# Patient Record
Sex: Female | Born: 1945 | Race: White | Hispanic: No | Smoking: Current every day smoker
Health system: Southern US, Community
[De-identification: ages and names within clinical notes are randomized; demographics above are authoritative.]

## PROBLEM LIST (undated history)

## (undated) DIAGNOSIS — I4891 Unspecified atrial fibrillation: Secondary | ICD-10-CM

## (undated) DIAGNOSIS — I10 Essential (primary) hypertension: Secondary | ICD-10-CM

## (undated) HISTORY — PX: ORIF FEMUR FRACTURE: SHX2119

## (undated) HISTORY — PX: EXPLORATORY LAPAROTOMY: SUR591

## (undated) HISTORY — DX: Essential (primary) hypertension: I10

## (undated) HISTORY — PX: HYSTEROTOMY: SHX1776

## (undated) HISTORY — PX: TUBAL LIGATION: SHX77

---

## 2011-04-02 DIAGNOSIS — R319 Hematuria, unspecified: Secondary | ICD-10-CM | POA: Insufficient documentation

## 2013-07-10 DIAGNOSIS — M47816 Spondylosis without myelopathy or radiculopathy, lumbar region: Secondary | ICD-10-CM | POA: Insufficient documentation

## 2013-07-10 DIAGNOSIS — M549 Dorsalgia, unspecified: Secondary | ICD-10-CM | POA: Insufficient documentation

## 2016-09-08 ENCOUNTER — Encounter: Payer: Self-pay | Admitting: Family Medicine

## 2016-09-08 ENCOUNTER — Ambulatory Visit (INDEPENDENT_AMBULATORY_CARE_PROVIDER_SITE_OTHER): Payer: Medicare Other | Admitting: Family Medicine

## 2016-09-08 ENCOUNTER — Other Ambulatory Visit (HOSPITAL_COMMUNITY)
Admission: RE | Admit: 2016-09-08 | Discharge: 2016-09-08 | Disposition: A | Discharge status: Home / Self Care | Payer: Medicare Other | Source: Ambulatory Visit | Attending: Family Medicine | Admitting: Family Medicine

## 2016-09-08 VITALS — BP 122/78 | HR 72 | Wt 208.0 lb

## 2016-09-08 DIAGNOSIS — F329 Major depressive disorder, single episode, unspecified: Secondary | ICD-10-CM | POA: Insufficient documentation

## 2016-09-08 DIAGNOSIS — D510 Vitamin B12 deficiency anemia due to intrinsic factor deficiency: Secondary | ICD-10-CM | POA: Insufficient documentation

## 2016-09-08 DIAGNOSIS — N898 Other specified noninflammatory disorders of vagina: Secondary | ICD-10-CM | POA: Diagnosis not present

## 2016-09-08 DIAGNOSIS — K219 Gastro-esophageal reflux disease without esophagitis: Secondary | ICD-10-CM | POA: Insufficient documentation

## 2016-09-08 DIAGNOSIS — E785 Hyperlipidemia, unspecified: Secondary | ICD-10-CM | POA: Insufficient documentation

## 2016-09-08 DIAGNOSIS — F419 Anxiety disorder, unspecified: Secondary | ICD-10-CM | POA: Insufficient documentation

## 2016-09-08 DIAGNOSIS — M810 Age-related osteoporosis without current pathological fracture: Secondary | ICD-10-CM | POA: Insufficient documentation

## 2016-09-08 DIAGNOSIS — F32A Depression, unspecified: Secondary | ICD-10-CM | POA: Insufficient documentation

## 2016-09-08 MED ORDER — NYSTATIN 100000 UNIT/GM EX POWD: Freq: Four times a day (QID) | CUTANEOUS | 0 refills | Status: AC

## 2016-09-08 NOTE — Patient Instructions (Addendum)
Miconazole aerosol powder or liquid What is this medicine? MICONAZOLE (mi KON a zole) is an antifungal medicine. It is used to treat certain kinds of fungal or yeast infections of the skin. This medicine may be used for other purposes; ask your health care provider or pharmacist if you have questions. COMMON BRAND NAME(S): Cruex, Desenex, Desenex Jock Itch, Lotrimin AF, Lotrimin AF Antifungal Liquid, Lotrimin AF Deodorant, Lotrimin AF Powder, Lotrimin AF Spray, Micatin, Neosporin AF, Ting Antifungal What should I tell my health care provider before I take this medicine? They need to know if you have any of these conditions: -diabetes -HIV or AIDS -immune system problems -other chronic health condition -recent chemotherapy treatments -an unusual or allergic reaction to miconazole, other medicines, foods, dyes or preservatives -pregnant or trying to get pregnant -breast-feeding How should I use this medicine? This medicine is for external use only. Do not take by mouth. Follow the directions on the label. Wash hands before and after use. Cleanse and dry affected area thoroughly. Shake well. Spray on the affected area (or into socks and shoes). Do not breathe in the medicine. Do not use your medicine more often than directed. Use this medicine for the full amount of time recommended on the package or by your doctor or health care professional even if you begin to feel better. Do not use for more than 4 weeks without advice. Talk to your pediatrician regarding the use of this medicine in children. While this medicine may be prescribed for children as young as 2 years for selected conditions, precautions do apply. Overdosage: If you think you have taken too much of this medicine contact a poison control center or emergency room at once. NOTE: This medicine is only for you. Do not share this medicine with others. What if I miss a dose? If you miss a dose, use it as soon as you can. If it is almost  time for your next dose, use only that dose. Do not use double or extra doses. What may interact with this medicine? Interactions are not expected. Do not use any other skin products on the affected area without asking your doctor or health care professional. This list may not describe all possible interactions. Give your health care provider a list of all the medicines, herbs, non-prescription drugs, or dietary supplements you use. Also tell them if you smoke, drink alcohol, or use illegal drugs. Some items may interact with your medicine. What should I watch for while using this medicine? Tell your doctor or healthcare professional if your symptoms do not start to get better or if they get worse. You may have a skin infection that does not respond to this medicine. Do not spray near your eyes. If you do get any in your eyes, rinse out with plenty of cool tap water. If you are using this medicine for 'jock itch' be sure to dry the groin completely after bathing. Do not wear underwear that is tight-fitting or made from synthetic fibers like rayon or nylon. Wear loose-fitting, cotton underwear. If you are using this medicine for athlete's foot be sure to dry your feet carefully after bathing, especially between the toes. Do not wear socks made from wool or synthetic materials like rayon or nylon. Wear clean cotton socks and change them at least once a day, change them more if your feet sweat a lot. Also, try to wear sandals or shoes that are well-ventilated. What side effects may I notice from receiving this medicine? Side  effects that you should report to your doctor or health care professional as soon as possible: -allergic reactions like skin rash, itching or hives, swelling of the face, lips, or tongue -increased inflammation, redness, or pain at the affected area Side effects that usually do not require medical attention (report to your doctor or health care professional if they continue or are  bothersome): -mild skin irritation, burning, or itching at the affected area This list may not describe all possible side effects. Call your doctor for medical advice about side effects. You may report side effects to FDA at 1-800-FDA-1088. Where should I keep my medicine? Keep out of the reach of children. Store at room temperature between 15 and 30 degrees C (59 and 86 degrees F). Throw away any unused medicine after the expiration date. Some products may contain alcohol or have other flammable components. Do not use while smoking or near heat or flame. NOTE: This sheet is a summary. It may not cover all possible information. If you have questions about this medicine, talk to your doctor, pharmacist, or health care provider.  2018 Elsevier/Gold Standard (2007-07-28 10:56:02)

## 2016-09-08 NOTE — Progress Notes (Signed)
   Subjective:    Patient ID: Becky Warner is a 71 y.o. female presenting with VAGINAL BUMPS  on 09/08/2016  HPI: Has noted several week h/o clear vaginal discharge and blistered skin in her groin. Has had previous yeast infections with clumpy discharge. Has not had a new sexual partner. Previous hysterectomy. Reports some leakage of urine with heavy lifting and was wearing a pad, but has not for the past several weeks. Has been using a homeopathic thick cream to help ease the pain of the groin.  Review of Systems  Constitutional: Negative for chills and fever.  Respiratory: Negative for shortness of breath.   Cardiovascular: Negative for chest pain.  Gastrointestinal: Negative for abdominal pain, nausea and vomiting.  Genitourinary: Positive for vaginal discharge and vaginal pain. Negative for dysuria and menstrual problem.  Skin: Negative for rash.      Objective:    There were no vitals taken for this visit. Physical Exam  Constitutional: She is oriented to person, place, and time. She appears well-developed and well-nourished. No distress.  HENT:  Head: Normocephalic and atraumatic.  Eyes: No scleral icterus.  Neck: Neck supple.  Cardiovascular: Normal rate.   Pulmonary/Chest: Effort normal.  Abdominal: Soft.  Genitourinary: Vaginal discharge (clear) found.  Genitourinary Comments: Erythematous plaque noted along intertrigonal region bilaterally  Neurological: She is alert and oriented to person, place, and time.  Skin: Skin is warm and dry.  Psychiatric: She has a normal mood and affect.        Assessment & Plan:  Vaginal irritation - likely tinea--trial of nystatin powder. keep area dry. - Plan: Cervicovaginal ancillary only, nystatin (MYCOSTATIN/NYSTOP) powder   Total face-to-face time with patient: 20 minutes. Over 50% of encounter was spent on counseling and coordination of care. Return in about 3 months (around 12/09/2016).  Reva Boresanya S Renita Brocks 09/08/2016 3:38 PM

## 2016-09-09 ENCOUNTER — Encounter: Payer: Self-pay | Admitting: Family Medicine

## 2016-09-10 LAB — CERVICOVAGINAL ANCILLARY ONLY
Bacterial vaginitis: NEGATIVE
CANDIDA VAGINITIS: POSITIVE — AB
Trichomonas: NEGATIVE

## 2016-09-11 ENCOUNTER — Telehealth: Payer: Self-pay

## 2016-09-11 MED ORDER — FLUCONAZOLE 150 MG PO TABS: 150.0000 mg | ORAL_TABLET | Freq: Once | ORAL | 0 refills | Status: DC

## 2016-09-11 NOTE — Telephone Encounter (Signed)
-----   Message from Reva Boresanya S Pratt, MD sent at 09/11/2016 10:19 AM EDT ----- Has yeast on wet prep--may send additional Diflucan--already on powder

## 2016-09-11 NOTE — Telephone Encounter (Signed)
Attempted to reach patient multiple time and phone rings busy. Armandina StammerJennifer Howard RNBSN

## 2016-10-06 ENCOUNTER — Telehealth: Payer: Self-pay | Admitting: General Practice

## 2016-10-06 ENCOUNTER — Other Ambulatory Visit: Payer: Self-pay | Admitting: Family Medicine

## 2016-10-06 NOTE — Telephone Encounter (Signed)
Patient called and left message stating she is a patient of Dr Tawni LevyPratt's and was recently treated for a vaginal infection with a medication. Patient states the medication hasn't really worked or done anything & she would like a refill.

## 2017-01-15 ENCOUNTER — Other Ambulatory Visit: Payer: Self-pay | Admitting: Family Medicine

## 2017-05-24 ENCOUNTER — Other Ambulatory Visit: Payer: Self-pay | Admitting: Family Medicine

## 2017-06-06 ENCOUNTER — Other Ambulatory Visit: Payer: Self-pay | Admitting: Family Medicine

## 2019-06-02 ENCOUNTER — Other Ambulatory Visit: Payer: Self-pay

## 2019-06-02 ENCOUNTER — Emergency Department (INDEPENDENT_AMBULATORY_CARE_PROVIDER_SITE_OTHER): Payer: Medicare Other

## 2019-06-02 ENCOUNTER — Emergency Department (INDEPENDENT_AMBULATORY_CARE_PROVIDER_SITE_OTHER)
Admission: EM | Admit: 2019-06-02 | Discharge: 2019-06-02 | Disposition: A | Discharge status: Home / Self Care | Payer: Medicare Other | Source: Home / Self Care

## 2019-06-02 DIAGNOSIS — M5416 Radiculopathy, lumbar region: Secondary | ICD-10-CM

## 2019-06-02 MED ORDER — MELOXICAM 7.5 MG PO TABS: 7.5000 mg | ORAL_TABLET | Freq: Every day | ORAL | 0 refills | Status: AC

## 2019-06-02 MED ORDER — KETOROLAC TROMETHAMINE 30 MG/ML IJ SOLN
30.0000 mg | Freq: Once | INTRAMUSCULAR | Status: AC
  Administered 2019-06-02: 14:00:00 30 mg via INTRAMUSCULAR

## 2019-06-02 NOTE — ED Triage Notes (Addendum)
Pt c/o bilateral leg pain x 1 mos. Worsening in the last couple days. Hx of DDD and sciatica. Pain 7/10. Taking hydrocodone as needed with little relief.

## 2019-06-02 NOTE — Discharge Instructions (Addendum)
  Meloxicam (Mobic) is an antiinflammatory to help with pain and inflammation.  Do not take ibuprofen, Advil, Aleve, or any other medications that contain NSAIDs while taking meloxicam as this may cause stomach upset or even ulcers if taken in large amounts for an extended period of time.   Please call to schedule an appointment with Sports Medicine next week or follow up with your family doctor next week as previously scheduled. You may need further evaluation with an MRI of your lower spine and/or referral to a physical therapist or neurosurgeon for further evaluation and treatment of your pain.  Call 911 or have someone drive you to the hospital if you develop worsening lower back pain and are unable to walk, difficulty urinating or having a bowel movement, weakness in your legs, or other new concerning symptoms develop.

## 2019-06-02 NOTE — ED Provider Notes (Signed)
[facility]    CSN: [id number] Arrival date & time: 06/02/19  1300      History   Chief Complaint Chief Complaint  Patient presents with  . Leg Pain    bilateral    HPI Becky Warner is a 74 y.o. female.   HPI  Becky Warner is a 75 y.o. female presenting to UC with c/o bilateral upper leg pain that is aching and sore, sharp at times for about 1 month. Pt reports pain started shortly after retiring, where she use to carry 2 pounds of water in each hand and walk around watering trees.  Pain has worsened over the last 1-2 weeks. She called her chiropractor to see if he could "do an adjustment" but due to hx of DDD and sciatica with pain in her upper legs, pt was directed to UC for further evaluation.  She has taken hydrocodone with mild relief. Denies change in bowel or bladder habits. Denies weakness or numbness to her legs. No fever, chills or rashes. No recent injury.    Past Medical History:  Diagnosis Date  . Hypertension     Patient Active Problem List   Diagnosis Date Noted  . Anxiety 09/08/2016  . Depression 09/08/2016  . GERD (gastroesophageal reflux disease) 09/08/2016  . Hyperlipidemia 09/08/2016  . Osteoporosis 09/08/2016  . Pernicious anemia 09/08/2016  . Back pain 07/10/2013  . Hematuria of undiagnosed cause 04/02/2011    Past Surgical History:  Procedure Laterality Date  . EXPLORATORY LAPAROTOMY    . HYSTEROTOMY    . ORIF FEMUR FRACTURE    . TUBAL LIGATION      OB History    Gravida  3   Para  3   Term      Preterm      AB      Living  3     SAB      TAB      Ectopic      Multiple      Live Births               Home Medications    Prior to Admission medications   Medication Sig Start Date End Date Taking? Authorizing Provider  albuterol (PROAIR HFA) 108 (90 Base) MCG/ACT inhaler INHALE 1 PUFF INTO THE LUNGS EVERY 6 (SIX) HOURS AS NEEDED FOR WHEEZING OR SHORTNESS OF BREATH. 06/14/15   [provider]    ALPRAZolam Duanne Moron) 1 MG tablet Take 1 mg by mouth 2 (two) times daily. 09/03/16   [provider]  amLODipine (NORVASC) 5 MG tablet Take 10 mg by mouth.    [provider]  calcium carbonate (TUMS EX) 750 MG chewable tablet Chew 750 mg by mouth.    [provider]  estradiol (ESTRACE) 1 MG tablet Take 1 mg by mouth.    [provider]  HYDROcodone-acetaminophen (NORCO/VICODIN) 5-325 MG tablet Take 1 tablet by mouth every 6 (six) hours as needed. 05/13/19   [provider]  meloxicam (MOBIC) 7.5 MG tablet Take 1-2 tablets (7.5-15 mg total) by mouth daily for 7 days. 06/02/19 06/09/19  Noe Gens, PA-C  Multiple Vitamin (MULTIVITAMIN) capsule Take by mouth.    [provider]  nystatin (MYCOSTATIN/NYSTOP) powder Apply topically 4 (four) times daily. 09/08/16   Donnamae Jude, MD  omeprazole (PRILOSEC) 40 MG capsule TAKE ONE CAPSULE (40 MG TOTAL) BY MOUTH DAILY. 08/06/16   [provider]  pravastatin (PRAVACHOL) 40 MG tablet Take by mouth.  04/20/16   [provider]  pregabalin (LYRICA) 50 MG capsule 1 each. 07/16/16   [provider]  venlafaxine XR (EFFEXOR-XR) 75 MG 24 hr capsule Take 75 mg by mouth daily. 12/30/18   [provider]  vitamin C (ASCORBIC ACID) 500 MG tablet Take 500 mg by mouth.    [provider]    Family History History reviewed. No pertinent family history.  Social History Social History   Tobacco Use  . Smoking status: Current Every Day Smoker  . Smokeless tobacco: Current User  Substance Use Topics  . Alcohol use: No  . Drug use: No     Allergies   Penicillins, Sulfa antibiotics, Fluocinolone, and Sulfamethoxazole   Review of Systems Review of Systems  Constitutional: Negative for chills and fever.  Genitourinary: Negative for dysuria, frequency and hematuria.  Musculoskeletal: Positive for back pain and myalgias. Negative for gait problem, neck pain and neck  stiffness.  Neurological: Negative for weakness and numbness.     Physical Exam Triage Vital Signs ED Triage Vitals  Enc Vitals Group     BP 06/02/19 1320 (!) 148/83     Pulse Rate 06/02/19 1320 96     Resp 06/02/19 1320 18     Temp 06/02/19 1320 98 F (36.7 C)     Temp Source 06/02/19 1320 Oral     SpO2 06/02/19 1320 96 %     Weight 06/02/19 1324 185 lb (83.9 kg)     Height 06/02/19 1324 5\' 6"  (1.676 m)     Head Circumference --      Peak Flow --      Pain Score 06/02/19 1324 7     Pain Loc --      Pain Edu? --      Excl. in GC? --    No data found.  Updated Vital Signs BP (!) 148/83 (BP Location: Left Arm)   Pulse 96   Temp 98 F (36.7 C) (Oral)   Resp 18   Ht 5\' 6"  (1.676 m)   Wt 185 lb (83.9 kg)   SpO2 96%   BMI 29.86 kg/m   Visual Acuity Right Eye Distance:   Left Eye Distance:   Bilateral Distance:    Right Eye Near:   Left Eye Near:    Bilateral Near:     Physical Exam Vitals and nursing note reviewed.  Constitutional:      Appearance: Normal appearance. She is well-developed.  HENT:     Head: Normocephalic and atraumatic.  Cardiovascular:     Rate and Rhythm: Normal rate.  Pulmonary:     Effort: Pulmonary effort is normal.  Musculoskeletal:        General: Tenderness present. Normal range of motion.     Cervical back: Normal range of motion.     Comments: Mild tenderness across lower back. No localized tenderness. No masses palpated. Full ROM bilateral hips w/o crepitus. Mild tenderness to anterior upper legs. Full ROM knees.  Muscle compartments are soft.  Skin:    General: Skin is warm and dry.     Findings: No rash.  Neurological:     Mental Status: She is alert and oriented to person, place, and time.  Psychiatric:        Behavior: Behavior normal.      UC Treatments / Results  Labs (all labs ordered are listed, but only abnormal results are displayed) Labs Reviewed - No data to display  EKG   Radiology DG  Lumbar Spine  Complete  Result Date: 06/02/2019 CLINICAL DATA:  Acute low back pain two weeks.  Initial encounter. EXAM: LUMBAR SPINE - COMPLETE 4+ VIEW COMPARISON:  None. FINDINGS: No acute fracture or subluxation identified. Mild degenerative disc disease/spondylosis from L3-S1 noted. Diffuse osteopenia is present. No focal bony lesions are identified. IMPRESSION: 1. No evidence of acute abnormality. 2. Mild degenerative disc disease/spondylosis from L3-S1. Electronically Signed   By: Harmon Pier M.D.   On: 06/02/2019 14:17    Procedures Procedures (including critical care time)  Medications Ordered in UC Medications  ketorolac (TORADOL) 30 MG/ML injection 30 mg (30 mg Intramuscular Given 06/02/19 1346)    Initial Impression / Assessment and Plan / UC Course  I have reviewed the triage vital signs and the nursing notes.  Pertinent labs & imaging results that were available during my care of the patient were reviewed by me and considered in my medical decision making (see chart for details).    Discussed imaging with pt No red flag symptoms today. No evidence of cauda equina at this time.  Encouraged pt f/u with sports medicine for further evaluation and treatment of her symptoms.  Discussed symptoms that warrant emergent care in the ED. AVS provided.  Final Clinical Impressions(s) / UC Diagnoses   Final diagnoses:  Acute radicular low back pain     Discharge Instructions      Meloxicam (Mobic) is an antiinflammatory to help with pain and inflammation.  Do not take ibuprofen, Advil, Aleve, or any other medications that contain NSAIDs while taking meloxicam as this may cause stomach upset or even ulcers if taken in large amounts for an extended period of time.   Please call to schedule an appointment with Sports Medicine next week or follow up with your family doctor next week as previously scheduled. You may need further evaluation with an MRI of your lower spine and/or referral to a physical  therapist or neurosurgeon for further evaluation and treatment of your pain.  Call 911 or have someone drive you to the hospital if you develop worsening lower back pain and are unable to walk, difficulty urinating or having a bowel movement, weakness in your legs, or other new concerning symptoms develop.     ED Prescriptions    Medication Sig Dispense Auth. Provider   meloxicam (MOBIC) 7.5 MG tablet Take 1-2 tablets (7.5-15 mg total) by mouth daily for 7 days. 14 tablet Lurene Shadow, [place]     I have reviewed the PDMP during this encounter.   Lurene Shadow, [place] 06/02/19 1551

## 2019-06-06 ENCOUNTER — Encounter: Payer: Medicare Other | Admitting: Sports Medicine

## 2019-06-12 ENCOUNTER — Encounter: Payer: Medicare Other | Admitting: Sports Medicine

## 2019-06-13 ENCOUNTER — Other Ambulatory Visit: Payer: Self-pay

## 2019-06-13 ENCOUNTER — Encounter: Payer: Self-pay | Admitting: Sports Medicine

## 2019-06-13 ENCOUNTER — Ambulatory Visit (INDEPENDENT_AMBULATORY_CARE_PROVIDER_SITE_OTHER): Payer: Medicare Other | Admitting: Sports Medicine

## 2019-06-13 DIAGNOSIS — M47816 Spondylosis without myelopathy or radiculopathy, lumbar region: Secondary | ICD-10-CM

## 2019-06-13 DIAGNOSIS — M4807 Spinal stenosis, lumbosacral region: Secondary | ICD-10-CM | POA: Diagnosis not present

## 2019-06-13 MED ORDER — PREDNISONE 50 MG PO TABS: ORAL_TABLET | ORAL | 0 refills | Status: DC

## 2019-06-13 MED ORDER — PREGABALIN 100 MG PO CAPS: 100.0000 mg | ORAL_CAPSULE | Freq: Two times a day (BID) | ORAL | 3 refills | Status: DC

## 2019-06-13 NOTE — Progress Notes (Signed)
    Procedures performed today:    None.  Independent interpretation of notes and tests performed by another provider:   None.  Brief History, Exam, Impression, and Recommendations:    Lumbar spondylosis This is a pleasant 74 year old female, she is a long history of chronic low back pain, with radiation to the bilateral anterior thighs, better with flexion. She endorses that her worst symptoms are her anterior thigh pain. This is all consistent with lumbar spinal stenosis. She is currently seeing Dr. Oneal Grout and is getting some medical pain management here, it sounds as though she had a lumbar epidural at some point in the past without much improvement. She went to urgent care and was given meloxicam which is not helping much. 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 I explained to her the process of treatment and set expectations regarding improvement in her back pain. She will continue hydrocodone with Dr. Oneal Grout, I am going to increase her dose of Lyrica from 50 mg to 100 mg twice daily to 3 times daily, we are going to do a 5-day burst of prednisone, lumbar spine MRI for interventional planning and aggressive formal physical therapy, I like to see her back in approximately 6 weeks and we can proceed with interventional treatment in the forms of epidurals and/or facet joint injections if no better.    ___________________________________________ Ihor Austin. Benjamin Stain, M.D., ABFM., CAQSM. Primary Care and Sports Medicine [facility]  Adjunct Instructor of Family Medicine  [employer]

## 2019-06-13 NOTE — Assessment & Plan Note (Addendum)
This is a pleasant 74 year old female, she is a long history of chronic low back pain, with radiation to the bilateral anterior thighs, better with flexion. She endorses that her worst symptoms are her anterior thigh pain. This is all consistent with lumbar spinal stenosis. She is currently seeing Dr. Oneal Grout and is getting some medical pain management here, it sounds as though she had a lumbar epidural at some point in the past without much improvement. She went to urgent care and was given meloxicam which is not helping much. 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 I explained to her the process of treatment and set expectations regarding improvement in her back pain. She will continue hydrocodone with Dr. Oneal Grout, I am going to increase her dose of Lyrica from 50 mg to 100 mg twice daily to 3 times daily, we are going to do a 5-day burst of prednisone, lumbar spine MRI for interventional planning and aggressive formal physical therapy, I like to see her back in approximately 6 weeks and we can proceed with interventional treatment in the forms of epidurals and/or facet joint injections if no better.

## 2019-06-16 ENCOUNTER — Ambulatory Visit: Payer: Medicare Other | Admitting: Rehabilitative and Restorative Service Providers"

## 2019-06-17 ENCOUNTER — Other Ambulatory Visit: Payer: Self-pay

## 2019-06-17 ENCOUNTER — Ambulatory Visit (INDEPENDENT_AMBULATORY_CARE_PROVIDER_SITE_OTHER): Payer: Medicare Other

## 2019-06-17 DIAGNOSIS — M4807 Spinal stenosis, lumbosacral region: Secondary | ICD-10-CM

## 2019-06-17 DIAGNOSIS — M47816 Spondylosis without myelopathy or radiculopathy, lumbar region: Secondary | ICD-10-CM | POA: Diagnosis not present

## 2019-06-20 ENCOUNTER — Ambulatory Visit: Payer: Medicare Other | Admitting: Physical Therapy

## 2019-06-21 ENCOUNTER — Ambulatory Visit: Payer: Medicare Other | Attending: Sports Medicine | Admitting: Physical Therapy

## 2019-06-21 ENCOUNTER — Other Ambulatory Visit: Payer: Self-pay

## 2019-06-21 ENCOUNTER — Encounter: Payer: Self-pay | Admitting: Physical Therapy

## 2019-06-21 DIAGNOSIS — G8929 Other chronic pain: Secondary | ICD-10-CM | POA: Insufficient documentation

## 2019-06-21 DIAGNOSIS — R2689 Other abnormalities of gait and mobility: Secondary | ICD-10-CM | POA: Diagnosis present

## 2019-06-21 DIAGNOSIS — M545 Low back pain, unspecified: Secondary | ICD-10-CM

## 2019-06-21 DIAGNOSIS — R29898 Other symptoms and signs involving the musculoskeletal system: Secondary | ICD-10-CM | POA: Diagnosis present

## 2019-06-21 NOTE — Therapy (Signed)
[facility] Outpatient Rehabilitation [facility] [address]  [address] [place], [place], [address] Phone: [phone]   Fax:  [phone]  Physical Therapy Evaluation  Patient Details  Name: Becky Warner MRN: [id number] Date of Birth: [birth year] Referring Provider (PT): Rodney Langton, MD   Encounter Date: 06/21/2019  PT End of Session - 06/21/19 1016    Visit Number  1    Number of Visits  13    Date for PT Re-Evaluation  08/02/19    Authorization Type  Medicare & Medicaid    PT Start Time  0920    PT Stop Time  1009    PT Time Calculation (min)  49 min    Activity Tolerance  Patient tolerated treatment well    Behavior During Therapy  [facility] for tasks assessed/performed       Past Medical History:  Diagnosis Date  . Hypertension     Past Surgical History:  Procedure Laterality Date  . EXPLORATORY LAPAROTOMY    . HYSTEROTOMY    . ORIF FEMUR FRACTURE    . TUBAL LIGATION      There were no vitals filed for this visit.   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 Feels that this increased her pain, so she has retired from this occupation. This past year the pain has been "worse than ever." Wearing a back brace today d/t increased pain this AM. Pain is located over B LB with intermittent radiation to B anterior thighs stopping at knees. Denies N/T but does report intermittent difficulty urinating for the past 6 months. Pain worse in AM and better as the day goes on and worse with prolonged sitting, especially when sitting upright.  Pain is not much better with pain meds. Most comfortable laying in R/L sidelying. Had been seeing a Chiropractor for her back pain, but not in the past 2 weeks.    Pertinent History  HTN, ORIF L femur fx    Limitations  Sitting;Lifting;Standing;Walking;House  hold activities    How long can you sit comfortably?  20 min    How long can you stand comfortably?  >10 min    How long can you walk comfortably?  >1 hour    Diagnostic tests  06/02/19 lumbar spine: Mild degenerative disc disease/spondylosis from L3-S1; 06/17/19 lumbar MRI: Advanced lower lumbar facet arthrosis; No evidence of neural impingement.    Patient Stated Goals  get rid of pain    Currently in Pain?  No/denies    Pain Location  Back    Pain Orientation  Right;Left;Lower    Pain Descriptors / Indicators  Sharp    Pain Type  Chronic pain    Pain Radiating Towards  B anterior thighs stopping at knees         [facility] PT Assessment - 06/21/19 0933      Assessment   Medical Diagnosis  Lumbar spondylosis    Referring Provider (PT)  Rodney Langton, MD    Onset Date/Surgical Date  06/20/16    Prior Therapy  no      Precautions   Precautions  None      Balance Screen   Has the patient fallen in the past 6 months  Yes    How many times?  2   tripped over  [employer]- hurt L rib but improving   Has the patient had a decrease in activity level because of a fear of falling?   Yes    Is the patient reluctant to leave their home because of a fear of falling?   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    Leisure  gardening      Cognition   Overall Cognitive Status  Within Functional Limits for tasks assessed      Sensation   Light Touch  Appears Intact      Coordination   Gross Motor  Movements are Fluid and Coordinated  Yes      Posture/Postural Control   Posture/Postural Control  Postural limitations    Postural Limitations  Rounded Shoulders;Forward head;Increased thoracic kyphosis;Weight shift left    Posture Comments  in sitting      ROM / Strength   AROM / PROM / Strength  AROM;Strength      AROM   AROM Assessment Site  Lumbar    Lumbar Flexion  toes    Lumbar Extension  mildly limited    Lumbar - Right Side Bend  proximal shin    Lumbar - Left Side Bend  proximal shin    Lumbar - Right Rotation  WNL    Lumbar - Left Rotation  WNL      Strength   Strength Assessment Site  Hip;Knee;Ankle    Right/Left Hip  Right;Left    Right Hip Flexion  4+/5    Right Hip Extension  4-/5    Right Hip External Rotation   4+/5    Right Hip Internal Rotation  4/5    Right Hip ABduction  4+/5    Right Hip ADduction  4+/5    Left Hip Flexion  4+/5    Left Hip Extension  4/5    Left Hip External Rotation  4+/5    Left Hip Internal Rotation  4/5    Left Hip ABduction  4+/5    Left Hip ADduction  4+/5    Right/Left Knee  Right;Left    Right Knee Flexion  4+/5    Right Knee Extension  4+/5    Left Knee Flexion  4+/5    Left Knee Extension  4+/5    Right/Left Ankle  Right;Left    Right Ankle Dorsiflexion  4+/5    Right Ankle Plantar Flexion  4+/5    Left Ankle Dorsiflexion  4+/5    Left Ankle Plantar Flexion  4+/5      Flexibility   Soft Tissue Assessment /Muscle Length  yes    Hamstrings  B WFL    Quadriceps  mildly tight on B LEs with mod thomas stretch    Piriformis  L moderately tight with fig 4 and KTOS, mildly on R LE  Palpation   Palpation comment  TTP over midline of L4, over B proximal glutes and QL; increased tone over B posterior chain      Ambulation/Gait   Ambulation/Gait  Yes    Assistive device  None    Gait Pattern  Step-to pattern;Step-through pattern;Lateral trunk lean to left;Lateral trunk lean to right;Decreased step length -  right;Decreased step length - left;Trunk flexed    Ambulation Surface  Level;Indoor    Gait velocity  slightly decreased                  Objective measurements completed on examination: See above findings.              PT Education - 06/21/19 1016    Education Details  prognosis, POC, HEP, discussion on benefits of continued movement for arthritic pain    Person(s) Educated  Patient    Methods  Explanation;Demonstration;Tactile cues;Verbal cues;Handout    Comprehension  Verbalized understanding;Returned demonstration       PT Short Term Goals - 06/21/19 1028      PT SHORT TERM GOAL #1   Title  Patient to be independent with initial HEP.    Time  3    Period  Weeks    Status  New    Target Date  07/12/19        PT Long Term Goals - 06/21/19 1028      PT LONG TERM GOAL #1   Title  Patient to be independent with advanced HEP.    Time  6    Period  Weeks    Status  New    Target Date  08/02/19      PT LONG TERM GOAL #2   Title  Patient to demonstrate B hip strength >/=4+/5.    Time  6    Period  Weeks    Status  New    Target Date  08/02/19      PT LONG TERM GOAL #3   Title  Patient to demonstrate mild tightness in B piriformis and hip flexor/quads remaining.    Time  6    Period  Weeks    Status  New    Target Date  08/02/19      PT LONG TERM GOAL #4   Title  Patient to demonstrate proper lifting mechanics to lift 20lbs from ground to counter with 0/10 pain.    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    Time  6    Period  Weeks    Status  New    Target Date  08/02/19             Plan - 06/21/19 1020    Clinical Impression Statement  Patient is a 74y/o F presenting to OPPT with c/o insidious B LBP of at least 3 weeks duration. Pain occurs over B LB with intermittent radiation to B anterior  thighs, stopping at knees. Denies N/T but does report intermittent difficulty urinating for the past 6 months- recent lumbar MRI on 06/17/19 shows no neural compromise. Pain worse in AM and with prolonged upright sitting. 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 Now also works  part time as a cleaner and notes difficulty completing these tasks d/t LBP. Patient today presenting with rounded and forward flexed posture, decreased B hip extension and IR strength, tightness in B hip flexors and piriformis, TTP over midline of L4, B QLs, and proximal glutes, and gait deviations. Patient educated on stretching HEP and continued movement for her arthritic pain- patient reported understanding. Would benefit form skilled PT services 2x/week for 6 weeks to address aforementioned impairments.    Personal Factors and Comorbidities  Age;Sex;Comorbidity 2;Fitness;Past/Current Experience;Profession;Time since onset of injury/illness/exacerbation    Comorbidities  HTN, ORIF femur fx    Examination-Activity Limitations  Sit;Bend;Squat;Stairs;Carry;Stand;Dressing;Transfers;Hygiene/Grooming;Lift;Reach Overhead;Locomotion Level    Examination-Participation Restrictions  Church;Cleaning;Shop;Community Activity;Driving;Yard Work;Laundry;Meal Prep    Stability/Clinical Decision Making  Stable/Uncomplicated    Clinical Decision Making  Low    Rehab Potential  Good    PT Frequency  2x / week    PT Duration  6 weeks    PT Treatment/Interventions  ADLs/Self Care Home Management;Cryotherapy;Electrical Stimulation;Moist Heat;Traction;Balance training;Therapeutic exercise;Therapeutic activities;Functional mobility training;Stair training;Gait training;Ultrasound;Neuromuscular re-education;Patient/family education;Manual techniques;Taping;Energy conservation;Dry needling;Passive range of motion    PT Next Visit Plan  lumbar FOTO; reassess HEP; edu on lifting  mechanics and posture with ADLs    Consulted and Agree with Plan of Care  Patient       Patient will benefit from skilled therapeutic intervention in order to improve the following deficits and impairments:  Decreased activity tolerance, Decreased strength, Increased fascial restricitons, Pain, Decreased balance, Difficulty walking, Increased muscle spasms, Improper body mechanics, Decreased range of motion, Impaired flexibility, Postural dysfunction  Visit Diagnosis: Chronic bilateral low back pain without sciatica  Other symptoms and signs involving the musculoskeletal system  Other abnormalities of gait and mobility     Problem List Patient Active Problem List   Diagnosis Date Noted  . Anxiety 09/08/2016  . Depression 09/08/2016  . GERD (gastroesophageal reflux disease) 09/08/2016  . Hyperlipidemia 09/08/2016  . Osteoporosis 09/08/2016  . Pernicious anemia 09/08/2016  . Lumbar spondylosis 07/10/2013  . Hematuria of undiagnosed cause 04/02/2011    Anette Guarneri, PT, DPT 06/21/19 10:33 AM   [facility] [address]  [address] [place], [place], [address] Phone: [phone]   Fax:  [phone]  Name: Shaliyah Taite MRN: [id number] Date of Birth: 1945-12-31

## 2019-06-26 ENCOUNTER — Ambulatory Visit: Payer: Medicare Other

## 2019-06-26 ENCOUNTER — Other Ambulatory Visit: Payer: Self-pay

## 2019-06-26 DIAGNOSIS — R2689 Other abnormalities of gait and mobility: Secondary | ICD-10-CM

## 2019-06-26 DIAGNOSIS — G8929 Other chronic pain: Secondary | ICD-10-CM

## 2019-06-26 DIAGNOSIS — M545 Low back pain, unspecified: Secondary | ICD-10-CM

## 2019-06-26 DIAGNOSIS — R29898 Other symptoms and signs involving the musculoskeletal system: Secondary | ICD-10-CM

## 2019-06-26 NOTE — Patient Instructions (Signed)

## 2019-06-26 NOTE — Therapy (Signed)
[facility] Outpatient Rehabilitation [facility] [address]  [address] [place], [place], [address] Phone: [phone]   Fax:  [phone]  Physical Therapy Treatment  Patient Details  Name: Becky Warner MRN: [id number] Date of Birth: [birth year] Referring Provider (PT): Rodney Langton, MD   Encounter Date: 06/26/2019  PT End of Session - 06/26/19 1326    Visit Number  2    Number of Visits  13    Date for PT Re-Evaluation  08/02/19    Authorization Type  Medicare & Medicaid    PT Start Time  1320    PT Stop Time  1404    PT Time Calculation (min)  44 min    Activity Tolerance  Patient tolerated treatment well    Behavior During Therapy  [facility] for tasks assessed/performed       Past Medical History:  Diagnosis Date  . Hypertension     Past Surgical History:  Procedure Laterality Date  . EXPLORATORY LAPAROTOMY    . HYSTEROTOMY    . ORIF FEMUR FRACTURE    . TUBAL LIGATION      There were no vitals filed for this visit.  Subjective Assessment - 06/26/19 1323    Subjective  Pt. noting much increased pain when bending over this morning.    Pertinent History  HTN, ORIF L femur fx    Diagnostic tests  06/02/19 lumbar spine: Mild degenerative disc disease/spondylosis from L3-S1; 06/17/19 lumbar MRI: Advanced lower lumbar facet arthrosis; No evidence of neural impingement.    Patient Stated Goals  get rid of pain    Currently in Pain?  No/denies    Pain Location  Back    Pain Orientation  Lower;Right;Left    Pain Descriptors / Indicators  Sharp    Pain Type  Chronic pain    Multiple Pain Sites  No         [facility] PT Assessment - 06/26/19 0001      Observation/Other Assessments   Focus on Therapeutic Outcomes (FOTO)   lumbar FOTO: 41% (59% limitation)                    [facility] Adult PT Treatment/Exercise - 06/26/19 0001      Self-Care   Self-Care  Other Self-Care Comments    Other Self-Care Comments   Instruction in proper  posture and body mechanics with daily activities as to reduce lumbar strain with sweeping, vacuuming, mopping, other household tasks; 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      Lumbar Exercises: Stretches   Lower Trunk Rotation Limitations  5" x 10      Piriformis Stretch  Right;Left;1 rep;30 seconds    Piriformis Stretch Limitations  KTOS    Figure 4 Stretch  2 reps;30 seconds    Figure 4 Stretch Limitations  cues for positioning      Lumbar Exercises: Aerobic   Nustep  Lvl 3, 6 min (LE only)             PT Education - 06/26/19 1823    Education Details  Instuction in proper posture and body mechanics    Person(s) Educated  Patient    Methods  Explanation;Demonstration;Verbal cues;Handout    Comprehension  Verbalized understanding;Returned demonstration;Verbal cues required       PT Short Term Goals - 06/26/19 1355      PT SHORT TERM GOAL #1   Title  Patient to be independent with initial HEP.  Time  3    Period  Weeks    Status  On-going    Target Date  07/12/19        PT Long Term Goals - 06/26/19 1355      PT LONG TERM GOAL #1   Title  Patient to be independent with advanced HEP.    Time  6    Period  Weeks    Status  On-going      PT LONG TERM GOAL #2   Title  Patient to demonstrate B hip strength >/=4+/5.    Time  6    Period  Weeks    Status  On-going      PT LONG TERM GOAL #3   Title  Patient to demonstrate mild tightness in B piriformis and hip flexor/quads remaining.    Time  6    Period  Weeks    Status  On-going      PT LONG TERM GOAL #4   Title  Patient to demonstrate proper lifting mechanics to lift 20lbs from ground to counter with 0/10 pain.    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    Time  6    Period  Weeks    Status  On-going            Plan - 06/26/19 1355    Clinical Impression Statement   Pt. noting increased LBP today.  Reviewed home stretches for HEP with only mild cueing required for positioning.  Covered instruction in proper posture and body mechanics with daily activities focusing on home cleaning tasks as pt. with complaint of LBP with activities such as sweeping, mopping.  Pt. verbalizing understanding.  Noted some LBP improvement by end of session.    Comorbidities  HTN, ORIF femur fx    Rehab Potential  Good    PT Frequency  2x / week    PT Treatment/Interventions  ADLs/Self Care Home Management;Cryotherapy;Electrical Stimulation;Moist Heat;Traction;Balance training;Therapeutic exercise;Therapeutic activities;Functional mobility training;Stair training;Gait training;Ultrasound;Neuromuscular re-education;Patient/family education;Manual techniques;Taping;Energy conservation;Dry needling;Passive range of motion    PT Next Visit Plan  Edu on lifting mechanics and posture with ADLs    Consulted and Agree with Plan of Care  Patient       Patient will benefit from skilled therapeutic intervention in order to improve the following deficits and impairments:  Decreased activity tolerance, Decreased strength, Increased fascial restricitons, Pain, Decreased balance, Difficulty walking, Increased muscle spasms, Improper body mechanics, Decreased range of motion, Impaired flexibility, Postural dysfunction  Visit Diagnosis: Chronic bilateral low back pain without sciatica  Other symptoms and signs involving the musculoskeletal system  Other abnormalities of gait and mobility     Problem List Patient Active Problem List   Diagnosis Date Noted  . Anxiety 09/08/2016  . Depression 09/08/2016  . GERD (gastroesophageal reflux disease) 09/08/2016  . Hyperlipidemia 09/08/2016  . Osteoporosis 09/08/2016  . Pernicious anemia 09/08/2016  . Lumbar spondylosis 07/10/2013  . Hematuria of undiagnosed cause 04/02/2011    Bess Harvest, PTA 06/26/19 6:24 PM   [facility] [address]  [place], [place], [address] Phone: [phone]   Fax:  [phone]  Name: Becky Warner MRN: [id number] Date of Birth: [birth year]

## 2019-06-29 ENCOUNTER — Ambulatory Visit: Payer: Medicare Other

## 2019-07-04 ENCOUNTER — Ambulatory Visit: Payer: Medicare Other | Attending: Sports Medicine

## 2019-07-04 ENCOUNTER — Other Ambulatory Visit: Payer: Self-pay

## 2019-07-04 DIAGNOSIS — R29898 Other symptoms and signs involving the musculoskeletal system: Secondary | ICD-10-CM | POA: Diagnosis present

## 2019-07-04 DIAGNOSIS — G8929 Other chronic pain: Secondary | ICD-10-CM | POA: Diagnosis present

## 2019-07-04 DIAGNOSIS — M545 Low back pain, unspecified: Secondary | ICD-10-CM

## 2019-07-04 DIAGNOSIS — R2689 Other abnormalities of gait and mobility: Secondary | ICD-10-CM

## 2019-07-04 NOTE — Therapy (Signed)
[facility] [address]  [place], [place], [address] Phone: [phone]   Fax:  [phone]  Physical Therapy Treatment  Patient Details  Name: Becky Warner MRN: [id number] Date of Birth: [birth year] Referring Provider (PT): Aundria Mems, MD   Encounter Date: 07/04/2019  PT End of Session - 07/04/19 1329    Visit Number  3    Number of Visits  13    Date for PT Re-Evaluation  08/02/19    Authorization Type  Medicare & Medicaid    PT Start Time  1315    PT Stop Time  1358    PT Time Calculation (min)  43 min    Activity Tolerance  Patient tolerated treatment well    Behavior During Therapy  [facility] for tasks assessed/performed       Past Medical History:  Diagnosis Date  . Hypertension     Past Surgical History:  Procedure Laterality Date  . EXPLORATORY LAPAROTOMY    . HYSTEROTOMY    . ORIF FEMUR FRACTURE    . TUBAL LIGATION      There were no vitals filed for this visit.  Subjective Assessment - 07/04/19 1320    Subjective  Pt. noting good pain relief since starting therapy.    Pertinent History  HTN, ORIF L femur fx    Diagnostic tests  06/02/19 lumbar spine: Mild degenerative disc disease/spondylosis from L3-S1; 06/17/19 lumbar MRI: Advanced lower lumbar facet arthrosis; No evidence of neural impingement.    Patient Stated Goals  get rid of pain    Currently in Pain?  Yes    Pain Score  0-No pain    Pain Location  Back    Pain Orientation  Lower                        [facility] Adult PT Treatment/Exercise - 07/04/19 0001      Self-Care   Self-Care  Other Self-Care Comments    Other Self-Care Comments   Pain education with pt. to reduce negative beliefs about LBP; Educated patient on use of general exercise (walking, swimming) to reduce LBP; avoiding excessive sitting       Lumbar Exercises: Stretches   Lower Trunk Rotation Limitations  5" x 10      Piriformis Stretch   Right;Left;1 rep;30 seconds    Piriformis Stretch Limitations  KTOS    Figure 4 Stretch  2 reps;30 seconds    Figure 4 Stretch Limitations  cues for positioning      Lumbar Exercises: Aerobic   Recumbent Bike  Lvl 1, 6 min       Lumbar Exercises: Standing   Row  Both;15 reps;Strengthening    Theraband Level (Row)  Level 2 (Red)    Row Limitations  cues for scap. retraction with good carryover       Lumbar Exercises: Supine   Dead Bug  10 reps;3 seconds    Dead Bug Limitations  UE/LE             PT Education - 07/04/19 1519    Education Details  HEP update;  row with red TB closed in door    Person(s) Educated  Patient    Methods  Explanation;Demonstration;Verbal cues    Comprehension  Verbalized understanding;Returned demonstration;Verbal cues required       PT Short Term Goals - 06/26/19 1355      PT SHORT TERM GOAL #1   Title  Patient to be independent with initial HEP.    Time  3    Period  Weeks    Status  On-going    Target Date  07/12/19        PT Long Term Goals - 06/26/19 1355      PT LONG TERM GOAL #1   Title  Patient to be independent with advanced HEP.    Time  6    Period  Weeks    Status  On-going      PT LONG TERM GOAL #2   Title  Patient to demonstrate B hip strength >/=4+/5.    Time  6    Period  Weeks    Status  On-going      PT LONG TERM GOAL #3   Title  Patient to demonstrate mild tightness in B piriformis and hip flexor/quads remaining.    Time  6    Period  Weeks    Status  On-going      PT LONG TERM GOAL #4   Title  Patient to demonstrate proper lifting mechanics to lift 20lbs from ground to counter with 0/10 pain.    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    Time  6    Period  Weeks    Status  On-going            Plan - 07/04/19 1321    Clinical Impression Statement  Pt. noting she has felt good pain  relief since start of therapy session.  Progressed LE stretching and reviewed posture and body mechanics with daily tasks with good carryover.  Initiated scapular strengthening without issue today and updated HEP accordingly.  Ended visit with pt. noting she has not had "big shots of back pain" since starting therapy.    Comorbidities  HTN, ORIF femur fx    Rehab Potential  Good    PT Treatment/Interventions  ADLs/Self Care Home Management;Cryotherapy;Electrical Stimulation;Moist Heat;Traction;Balance training;Therapeutic exercise;Therapeutic activities;Functional mobility training;Stair training;Gait training;Ultrasound;Neuromuscular re-education;Patient/family education;Manual techniques;Taping;Energy conservation;Dry needling;Passive range of motion    PT Next Visit Plan  Edu on lifting mechanics and posture with ADLs    Consulted and Agree with Plan of Care  Patient       Patient will benefit from skilled therapeutic intervention in order to improve the following deficits and impairments:  Decreased activity tolerance, Decreased strength, Increased fascial restricitons, Pain, Decreased balance, Difficulty walking, Increased muscle spasms, Improper body mechanics, Decreased range of motion, Impaired flexibility, Postural dysfunction  Visit Diagnosis: Chronic bilateral low back pain without sciatica  Other symptoms and signs involving the musculoskeletal system  Other abnormalities of gait and mobility     Problem List Patient Active Problem List   Diagnosis Date Noted  . Anxiety 09/08/2016  . Depression 09/08/2016  . GERD (gastroesophageal reflux disease) 09/08/2016  . Hyperlipidemia 09/08/2016  . Osteoporosis 09/08/2016  . Pernicious anemia 09/08/2016  . Lumbar spondylosis 07/10/2013  . Hematuria of undiagnosed cause 04/02/2011    Kermit Balo, PTA 07/04/19 6:00 PM   [facility] [address]  [address] [place],  [place], [address] Phone: [phone]   Fax:  [phone]  Name: Becky Warner MRN: [id number] Date of Birth: 11-Jan-1946

## 2019-07-06 ENCOUNTER — Other Ambulatory Visit: Payer: Self-pay

## 2019-07-06 ENCOUNTER — Ambulatory Visit: Payer: Medicare Other

## 2019-07-06 DIAGNOSIS — R29898 Other symptoms and signs involving the musculoskeletal system: Secondary | ICD-10-CM

## 2019-07-06 DIAGNOSIS — M545 Low back pain, unspecified: Secondary | ICD-10-CM

## 2019-07-06 DIAGNOSIS — R2689 Other abnormalities of gait and mobility: Secondary | ICD-10-CM

## 2019-07-06 NOTE — Therapy (Signed)
[facility] [address]  [place], [place], [address] Phone: [phone]   Fax:  [phone]  Physical Therapy Treatment  Patient Details  Name: Becky Warner MRN: [id number] Date of Birth: 10/25/1945 Referring Provider (PT): Aundria Mems, MD   Encounter Date: 07/06/2019  PT End of Session - 07/06/19 1320    Visit Number  4    Number of Visits  13    Date for PT Re-Evaluation  08/02/19    Authorization Type  Medicare & Medicaid    PT Start Time  1315    PT Stop Time  1408    PT Time Calculation (min)  53 min    Activity Tolerance  Patient tolerated treatment well    Behavior During Therapy  [facility] for tasks assessed/performed       Past Medical History:  Diagnosis Date  . Hypertension     Past Surgical History:  Procedure Laterality Date  . EXPLORATORY LAPAROTOMY    . HYSTEROTOMY    . ORIF FEMUR FRACTURE    . TUBAL LIGATION      There were no vitals filed for this visit.  Subjective Assessment - 07/06/19 1318    Subjective  Pt. 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    Pertinent History  HTN, ORIF L femur fx    Diagnostic tests  06/02/19 lumbar spine: Mild degenerative disc disease/spondylosis from L3-S1; 06/17/19 lumbar MRI: Advanced lower lumbar facet arthrosis; No evidence of neural impingement.    Patient Stated Goals  get rid of pain    Currently in Pain?  Yes    Pain Score  7     Pain Location  Back    Pain Orientation  Lower;Medial;Right;Left    Pain Descriptors / Indicators  --   "locking"   Pain Onset  More than a month ago    Pain Frequency  Constant    Aggravating Factors   cleaning cliants house    Multiple Pain Sites  No                        [facility] Adult PT Treatment/Exercise - 07/06/19 0001      Lumbar Exercises: Stretches   Single Knee to Chest Stretch  Right;Left;20 seconds;3 reps   pt. noting good LB thigh  stretch    Single Knee to Chest Stretch Limitations  B - towel behind knee - oppo LE straight     Lower Trunk Rotation Limitations  5" x 10      Lumbar Stabilization Level 1  3 reps;20 seconds    Lumbar Stabilization Level 1 Limitations  green p-ball rollouts sitting     Figure 4 Stretch  2 reps;30 seconds    Figure 4 Stretch Limitations  B       Lumbar Exercises: Aerobic   Recumbent Bike  Lvl 1, 6 min       Lumbar Exercises: Supine   Bridge  10 reps;3 seconds    Bridge Limitations  cues for positioning       Modalities   Modalities  Electrical Stimulation;Moist Heat      Moist Heat Therapy   Number Minutes Moist Heat  15 Minutes    Moist Heat Location  Lumbar Spine      Electrical Stimulation   Electrical Stimulation Location  lumbar spine     Electrical Stimulation Action  IFC  Electrical Stimulation Parameters  80-'150Hz' , intensity to pt. tolerance, 15'    Electrical Stimulation Goals  Pain;Tone             PT Education - 07/06/19 1359    Education Details  HEP update;  bridge, SKTC    Person(s) Educated  Patient    Methods  Explanation;Demonstration;Verbal cues;Handout    Comprehension  Verbalized understanding;Returned demonstration;Verbal cues required       PT Short Term Goals - 07/06/19 1332      PT SHORT TERM GOAL #1   Title  Patient to be independent with initial HEP.    Time  3    Period  Weeks    Status  Achieved    Target Date  07/12/19        PT Long Term Goals - 06/26/19 1355      PT LONG TERM GOAL #1   Title  Patient to be independent with advanced HEP.    Time  6    Period  Weeks    Status  On-going      PT LONG TERM GOAL #2   Title  Patient to demonstrate B hip strength >/=4+/5.    Time  6    Period  Weeks    Status  On-going      PT LONG TERM GOAL #3   Title  Patient to demonstrate mild tightness in B piriformis and hip flexor/quads remaining.    Time  6    Period  Weeks    Status  On-going      PT LONG TERM GOAL #4    Title  Patient to demonstrate proper lifting mechanics to lift 20lbs from ground to counter with 0/10 pain.    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    Time  6    Period  Weeks    Status  On-going            Plan - 07/06/19 1321    Clinical Impression Statement  STG #1 met as pt. noting adherence to HEP. 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  Able to reduce LBP and with report of improve mobility in lower back after gentle lumbopelvic ROM, LE stretching, and glute strengthening activities.  Ended visit with trial of E-stim/moist heat to lumbar spine with good relief of LBP noted.    Comorbidities  HTN, ORIF femur fx    Rehab Potential  Good    PT Frequency  2x / week    PT Treatment/Interventions  ADLs/Self Care Home Management;Cryotherapy;Electrical Stimulation;Moist Heat;Traction;Balance training;Therapeutic exercise;Therapeutic activities;Functional mobility training;Stair training;Gait training;Ultrasound;Neuromuscular re-education;Patient/family education;Manual techniques;Taping;Energy conservation;Dry needling;Passive range of motion    PT Next Visit Plan  Edu on lifting mechanics and posture with ADLs    Consulted and Agree with Plan of Care  Patient       Patient will benefit from skilled therapeutic intervention in order to improve the following deficits and impairments:  Decreased activity tolerance, Decreased strength, Increased fascial restricitons, Pain, Decreased balance, Difficulty walking, Increased muscle spasms, Improper body mechanics, Decreased range of motion, Impaired flexibility, Postural dysfunction  Visit Diagnosis: Chronic bilateral low back pain without sciatica  Other symptoms and signs involving the musculoskeletal system  Other abnormalities of gait and mobility  Problem List Patient  Active Problem List   Diagnosis Date Noted  . Anxiety 09/08/2016  . Depression 09/08/2016  . GERD (gastroesophageal reflux disease) 09/08/2016  . Hyperlipidemia 09/08/2016  . Osteoporosis 09/08/2016  . Pernicious anemia 09/08/2016  . Lumbar spondylosis 07/10/2013  . Hematuria of undiagnosed cause 04/02/2011    Bess Harvest, PTA 07/06/19 2:03 PM   [facility] [address]  [place], [place], [address] Phone: [phone]   Fax:  [phone]  Name: Becky Warner MRN: [id number] Date of Birth: 10-09-1945

## 2019-07-10 ENCOUNTER — Ambulatory Visit: Payer: Medicare Other

## 2019-07-11 ENCOUNTER — Other Ambulatory Visit: Payer: Self-pay

## 2019-07-11 ENCOUNTER — Ambulatory Visit: Payer: Medicare Other

## 2019-07-11 DIAGNOSIS — R29898 Other symptoms and signs involving the musculoskeletal system: Secondary | ICD-10-CM

## 2019-07-11 DIAGNOSIS — M545 Low back pain, unspecified: Secondary | ICD-10-CM

## 2019-07-11 DIAGNOSIS — R2689 Other abnormalities of gait and mobility: Secondary | ICD-10-CM

## 2019-07-11 DIAGNOSIS — G8929 Other chronic pain: Secondary | ICD-10-CM

## 2019-07-11 NOTE — Therapy (Signed)
[facility] [address]  [address] [place], [place], [address] Phone: [phone]   Fax:  [phone]  Physical Therapy Treatment  Patient Details  Name: Becky Warner MRN: [id number] Date of Birth: 1945/08/13 Referring Provider (PT): Rodney Langton, MD   Encounter Date: 07/11/2019  PT End of Session - 07/11/19 0900    Visit Number  5    Number of Visits  13    Date for PT Re-Evaluation  08/02/19    Authorization Type  Medicare & Medicaid    PT Start Time  [phone]    PT Stop Time  0930    PT Time Calculation (min)  38 min    Activity Tolerance  Patient tolerated treatment well    Behavior During Therapy  [facility] for tasks assessed/performed       Past Medical History:  Diagnosis Date  . Hypertension     Past Surgical History:  Procedure Laterality Date  . EXPLORATORY LAPAROTOMY    . HYSTEROTOMY    . ORIF FEMUR FRACTURE    . TUBAL LIGATION      There were no vitals filed for this visit.  Subjective Assessment - 07/11/19 0900    Subjective  Pt. reporting she had a fall a few days ago after tripping on her dog in her home falling on her knees and have felt increased LBP since this.    Pertinent History  HTN, ORIF L femur fx    Diagnostic tests  06/02/19 lumbar spine: Mild degenerative disc disease/spondylosis from L3-S1; 06/17/19 lumbar MRI: Advanced lower lumbar facet arthrosis; No evidence of neural impingement.    Patient Stated Goals  get rid of pain    Currently in Pain?  Yes    Pain Score  7     Pain Location  Back    Pain Orientation  Lower;Right    Pain Descriptors / Indicators  --   " locking "   Pain Type  Chronic pain    Multiple Pain Sites  No                        [facility] Adult PT Treatment/Exercise - 07/11/19 0001      Self-Care   Self-Care  Other Self-Care Comments    Other Self-Care Comments   Discussed strategies to prevent further falls with small dog as dog "follows me  around everywhere"      Lumbar Exercises: Stretches   Single Knee to Chest Stretch  Right;Left;20 seconds;2 reps    Single Knee to Chest Stretch Limitations  B - towel behind knee - oppo LE straight     Lower Trunk Rotation Limitations  5" x 10      Piriformis Stretch  Right;Left;1 rep;30 seconds    Piriformis Stretch Limitations  KTOS      Lumbar Exercises: Aerobic   Nustep  Lvl 4, 6 min (LE only)      Lumbar Exercises: Supine   Bridge  10 reps;3 seconds    Bridge Limitations  cues for positioning       Moist Heat Therapy   Number Minutes Moist Heat  15 Minutes    Moist Heat Location  Lumbar Spine      Electrical Stimulation   Electrical Stimulation Location  lumbar spine     Electrical Stimulation Action  IFC    Electrical Stimulation Parameters  80-150Hz , intensity to pt. tolerance, 15'    Statistician Goals  Pain;Tone               PT Short Term Goals - 07/06/19 1332      PT SHORT TERM GOAL #1   Title  Patient to be independent with initial HEP.    Time  3    Period  Weeks    Status  Achieved    Target Date  07/12/19        PT Long Term Goals - 06/26/19 1355      PT LONG TERM GOAL #1   Title  Patient to be independent with advanced HEP.    Time  6    Period  Weeks    Status  On-going      PT LONG TERM GOAL #2   Title  Patient to demonstrate B hip strength >/=4+/5.    Time  6    Period  Weeks    Status  On-going      PT LONG TERM GOAL #3   Title  Patient to demonstrate mild tightness in B piriformis and hip flexor/quads remaining.    Time  6    Period  Weeks    Status  On-going      PT LONG TERM GOAL #4   Title  Patient to demonstrate proper lifting mechanics to lift 20lbs from ground to counter with 0/10 pain.    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    Time  6    Period  Weeks    Status  On-going             Plan - 07/11/19 0901    Clinical Impression Statement  Pt. reporting she fell a few days ago after tripping on her small dog in her home.  Notes she landed on B knees after trying to catch herself on her nightstand.  Pt. reports only bruising and does note increased LBP since fall.  Able to reduce LBP and demonstrating improved posture after ROM and gentle LE stretching today.  Did discuss strategies to avoid further falls and reduce risk of falling tripping on pet with pt. verbalizing understanding.  Ended visit with E-stim/moist heat to lumbar spine to reduce pain.  Pt. leaving session with much improved pain levels.    Comorbidities  HTN, ORIF femur fx    Rehab Potential  Good    PT Frequency  2x / week    PT Treatment/Interventions  ADLs/Self Care Home Management;Cryotherapy;Electrical Stimulation;Moist Heat;Traction;Balance training;Therapeutic exercise;Therapeutic activities;Functional mobility training;Stair training;Gait training;Ultrasound;Neuromuscular re-education;Patient/family education;Manual techniques;Taping;Energy conservation;Dry needling;Passive range of motion    PT Next Visit Plan  Edu on lifting mechanics and posture with ADLs    Consulted and Agree with Plan of Care  Patient       Patient will benefit from skilled therapeutic intervention in order to improve the following deficits and impairments:  Decreased activity tolerance, Decreased strength, Increased fascial restricitons, Pain, Decreased balance, Difficulty walking, Increased muscle spasms, Improper body mechanics, Decreased range of motion, Impaired flexibility, Postural dysfunction  Visit Diagnosis: Chronic bilateral low back pain without sciatica  Other symptoms and signs involving the musculoskeletal system  Other abnormalities of gait and mobility     Problem List Patient Active Problem List   Diagnosis Date Noted  . Anxiety 09/08/2016  . Depression 09/08/2016  .  GERD (gastroesophageal  reflux disease) 09/08/2016  . Hyperlipidemia 09/08/2016  . Osteoporosis 09/08/2016  . Pernicious anemia 09/08/2016  . Lumbar spondylosis 07/10/2013  . Hematuria of undiagnosed cause 04/02/2011    Kermit Balo, PTA 07/11/19 12:26 PM   [facility] [address]  [address] [place], [place], [address] Phone: [phone]   Fax:  [phone]  Name: Becky Warner MRN: [id number] Date of Birth: 1946/01/17

## 2019-07-13 ENCOUNTER — Ambulatory Visit: Payer: Medicare Other

## 2019-07-17 ENCOUNTER — Encounter: Payer: Medicare Other | Admitting: Physical Therapy

## 2019-07-18 ENCOUNTER — Other Ambulatory Visit: Payer: Self-pay

## 2019-07-18 ENCOUNTER — Ambulatory Visit: Payer: Medicare Other

## 2019-07-18 DIAGNOSIS — M545 Low back pain, unspecified: Secondary | ICD-10-CM

## 2019-07-18 DIAGNOSIS — R2689 Other abnormalities of gait and mobility: Secondary | ICD-10-CM

## 2019-07-18 DIAGNOSIS — R29898 Other symptoms and signs involving the musculoskeletal system: Secondary | ICD-10-CM

## 2019-07-18 NOTE — Therapy (Signed)
[facility] [address]  [place], [place], [address] Phone: [phone]   Fax:  [phone]  Physical Therapy Treatment  Patient Details  Name: Becky Warner MRN: [id number] Date of Birth: [birth year] Referring Provider (PT): Aundria Mems, MD   Encounter Date: 07/18/2019   PT End of Session - 07/18/19 1406    Visit Number 7    Number of Visits 13    Date for PT Re-Evaluation 08/02/19    Authorization Type Medicare & Medicaid    PT Start Time 8270   Pt. arrived late   PT Stop Time 1402    PT Time Calculation (min) 38 min    Activity Tolerance Patient tolerated treatment well    Behavior During Therapy WFL for tasks assessed/performed           Past Medical History:  Diagnosis Date  . Hypertension     Past Surgical History:  Procedure Laterality Date  . EXPLORATORY LAPAROTOMY    . HYSTEROTOMY    . ORIF FEMUR FRACTURE    . TUBAL LIGATION      There were no vitals filed for this visit.   Subjective Assessment - 07/18/19 1406    Subjective Doing ok.  Notes she got stuck in traffic.    Pertinent History HTN, ORIF L femur fx    Diagnostic tests 06/02/19 lumbar spine: Mild degenerative disc disease/spondylosis from L3-S1; 06/17/19 lumbar MRI: Advanced lower lumbar facet arthrosis; No evidence of neural impingement.    Patient Stated Goals get rid of pain    Currently in Pain? No/denies    Pain Score 0-No pain    Multiple Pain Sites No                             [facility] Adult PT Treatment/Exercise - 07/18/19 0001      Self-Care   Self-Care Other Self-Care Comments    Other Self-Care Comments  practice proper mechancis lifting 20# wooden box from floor to mat table focusing on increased hip/knee flexion to reduce lumbar strain;  reviewed strategies with vacuuming with pt. job to prevent excessive bending pushing vacuum cleaner under tables, beds, couches to reduce lumbar strain         Lumbar Exercises: Aerobic   Nustep Lvl 4, 6 min (LE only)      Lumbar Exercises: Supine   Dead Bug 10 reps;3 seconds   Heavy cueing for proper hold times    Dead Bug Limitations UE/LE    Bridge --   x 12 rpes    Bridge Limitations cues for proper hold time                    PT Short Term Goals - 07/06/19 1332      PT SHORT TERM GOAL #1   Title Patient to be independent with initial HEP.    Time 3    Period Weeks    Status Achieved    Target Date 07/12/19             PT Long Term Goals - 06/26/19 1355      PT LONG TERM GOAL #1   Title Patient to be independent with advanced HEP.    Time 6    Period Weeks    Status On-going      PT LONG TERM GOAL #2   Title Patient to demonstrate B hip strength >/=4+/5.  Time 6    Period Weeks    Status On-going      PT LONG TERM GOAL #3   Title Patient to demonstrate mild tightness in B piriformis and hip flexor/quads remaining.    Time 6    Period Weeks    Status On-going      PT LONG TERM GOAL #4   Title Patient to demonstrate proper lifting mechanics to lift 20lbs from ground to counter with 0/10 pain.    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    Time 6    Period Weeks    Status On-going                 Plan - 07/18/19 1338    Clinical Impression Statement Pt. arrived late to session thus treatment time limited.  Pt. noting 70% improvement in her pain level since starting therapy.  Focused session on review of proper mechanics with lifting from floor and alternative positions for weeding garden and vacuuming to reduce lumbar strain.  Duration of session focused on core muscular activation awareness with pt. able to demo some improved tA activation by end of therex.  Ended visit with pt. noting she was pain free.    Comorbidities HTN, ORIF femur fx    Rehab Potential Good    PT Frequency  2x / week    PT Treatment/Interventions ADLs/Self Care Home Management;Cryotherapy;Electrical Stimulation;Moist Heat;Traction;Balance training;Therapeutic exercise;Therapeutic activities;Functional mobility training;Stair training;Gait training;Ultrasound;Neuromuscular re-education;Patient/family education;Manual techniques;Taping;Energy conservation;Dry needling;Passive range of motion    PT Next Visit Plan further posture with ADLs; lumbopelvic strengthening and postural awareness    Consulted and Agree with Plan of Care Patient           Patient will benefit from skilled therapeutic intervention in order to improve the following deficits and impairments:  Decreased activity tolerance, Decreased strength, Increased fascial restricitons, Pain, Decreased balance, Difficulty walking, Increased muscle spasms, Improper body mechanics, Decreased range of motion, Impaired flexibility, Postural dysfunction  Visit Diagnosis: Chronic bilateral low back pain without sciatica  Other symptoms and signs involving the musculoskeletal system  Other abnormalities of gait and mobility     Problem List Patient Active Problem List   Diagnosis Date Noted  . Anxiety 09/08/2016  . Depression 09/08/2016  . GERD (gastroesophageal reflux disease) 09/08/2016  . Hyperlipidemia 09/08/2016  . Osteoporosis 09/08/2016  . Pernicious anemia 09/08/2016  . Lumbar spondylosis 07/10/2013  . Hematuria of undiagnosed cause 04/02/2011    Kermit Balo, PTA 07/18/19 5:00 PM   [facility] [address]  [address] [place], [place], [address] Phone: [phone]   Fax:  [phone]  Name: Becky Warner MRN: [id number] Date of Birth: 09-Jun-1945

## 2019-07-20 ENCOUNTER — Encounter: Payer: Self-pay | Admitting: Physical Therapy

## 2019-07-20 ENCOUNTER — Ambulatory Visit: Payer: Medicare Other | Admitting: Physical Therapy

## 2019-07-20 ENCOUNTER — Other Ambulatory Visit: Payer: Self-pay

## 2019-07-20 DIAGNOSIS — R29898 Other symptoms and signs involving the musculoskeletal system: Secondary | ICD-10-CM

## 2019-07-20 DIAGNOSIS — M545 Low back pain, unspecified: Secondary | ICD-10-CM

## 2019-07-20 DIAGNOSIS — G8929 Other chronic pain: Secondary | ICD-10-CM

## 2019-07-20 DIAGNOSIS — R2689 Other abnormalities of gait and mobility: Secondary | ICD-10-CM

## 2019-07-20 NOTE — Therapy (Signed)
[facility] [address]  [address] [place], [place], [address] Phone: [phone]   Fax:  [phone]  Physical Therapy Treatment  Patient Details  Name: Becky Warner MRN: [id number] Date of Birth: 03-04-1945 Referring Provider (PT): Rodney Langton, MD   Encounter Date: 07/20/2019   PT End of Session - 07/20/19 1402    Visit Number 8    Number of Visits 13    Date for PT Re-Evaluation 08/02/19    Authorization Type Medicare & Medicaid    PT Start Time 1310    PT Stop Time 1353    PT Time Calculation (min) 43 min    Activity Tolerance Patient tolerated treatment well    Behavior During Therapy [facility] for tasks assessed/performed           Past Medical History:  Diagnosis Date  . Hypertension     Past Surgical History:  Procedure Laterality Date  . EXPLORATORY LAPAROTOMY    . HYSTEROTOMY    . ORIF FEMUR FRACTURE    . TUBAL LIGATION      There were no vitals filed for this visit.   Subjective Assessment - 07/20/19 1312    Subjective "This has helped tremendously." Having less pain in the LB, more in the midback. Interested in going to [employer].    Pertinent History HTN, ORIF L femur fx    Diagnostic tests 06/02/19 lumbar spine: Mild degenerative disc disease/spondylosis from L3-S1; 06/17/19 lumbar MRI: Advanced lower lumbar facet arthrosis; No evidence of neural impingement.    Patient Stated Goals get rid of pain    Currently in Pain? Yes    Pain Location Back    Pain Orientation Mid    Pain Descriptors / Indicators Aching    Pain Type Acute pain                             [facility] Adult PT Treatment/Exercise - 07/20/19 0001      Exercises   Exercises Lumbar      Lumbar Exercises: Machines for Strengthening   Cybex Lumbar Extension --    Cybex Knee Extension B LEs 2x10 15#   good control   Cybex Knee Flexion B LEs 2x10 15#   good control   Leg Press B LEs x10 20#   cues for  rhythmic breathing; good knee alignment   Other Lumbar Machine Exercise machine row x10 20#   cues on proper scapular squeeze to avoid shoulder hike   Other Lumbar Machine Exercise straight arm pulldown 10x 7#   cues to contract core and avoid anterior trunk lean     Lumbar Exercises: Supine   Bridge with clamshell 10 reps   red TB above knees   Other Supine Lumbar Exercises LTR x20 to tolerance                  PT Education - 07/20/19 1401    Education Details update to [employer]) Educated Patient    Methods Explanation;Demonstration;Tactile cues;Verbal cues;Handout    Comprehension Verbalized understanding;Returned demonstration            PT Short Term Goals - 07/06/19 1332      PT SHORT TERM GOAL #1   Title Patient to be independent with initial HEP.    Time 3    Period Weeks    Status Achieved    Target Date 07/12/19  PT Long Term Goals - 06/26/19 1355      PT LONG TERM GOAL #1   Title Patient to be independent with advanced HEP.    Time 6    Period Weeks    Status On-going      PT LONG TERM GOAL #2   Title Patient to demonstrate B hip strength >/=4+/5.    Time 6    Period Weeks    Status On-going      PT LONG TERM GOAL #3   Title Patient to demonstrate mild tightness in B piriformis and hip flexor/quads remaining.    Time 6    Period Weeks    Status On-going      PT LONG TERM GOAL #4   Title Patient to demonstrate proper lifting mechanics to lift 20lbs from ground to counter with 0/10 pain.    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    Time 6    Period Weeks    Status On-going                 Plan - 07/20/19 1402    Clinical Impression Statement Patient reporting good benefit in LBP from therapy thus far. Today noting increased midback pain without known cause. Notes that she is interested in returning to  the gym. Initiated machine strengthening for core and LE strengthening for improved comfort in gym environment. Patient performed scapular rows with initial trouble performing scapular retraction and instead with tendency for shoulder hiking. Form seemed to improve after cueing. Machine LE strengthening was well-tolerated and patient demonstrated good muscle control. Ended session with gentle lumbopelvic stretching and core strengthening with good effort. Updated HEP with machine strengthening as this was well-tolerated. Patient reported understanding and without complaints at end of session.    Comorbidities HTN, ORIF femur fx    Rehab Potential Good    PT Frequency 2x / week    PT Treatment/Interventions ADLs/Self Care Home Management;Cryotherapy;Electrical Stimulation;Moist Heat;Traction;Balance training;Therapeutic exercise;Therapeutic activities;Functional mobility training;Stair training;Gait training;Ultrasound;Neuromuscular re-education;Patient/family education;Manual techniques;Taping;Energy conservation;Dry needling;Passive range of motion    PT Next Visit Plan further posture with ADLs; lumbopelvic strengthening and postural awareness    Consulted and Agree with Plan of Care Patient           Patient will benefit from skilled therapeutic intervention in order to improve the following deficits and impairments:  Decreased activity tolerance, Decreased strength, Increased fascial restricitons, Pain, Decreased balance, Difficulty walking, Increased muscle spasms, Improper body mechanics, Decreased range of motion, Impaired flexibility, Postural dysfunction  Visit Diagnosis: Chronic bilateral low back pain without sciatica  Other symptoms and signs involving the musculoskeletal system  Other abnormalities of gait and mobility     Problem List Patient Active Problem List   Diagnosis Date Noted  . Anxiety 09/08/2016  . Depression 09/08/2016  . GERD (gastroesophageal reflux disease)  09/08/2016  . Hyperlipidemia 09/08/2016  . Osteoporosis 09/08/2016  . Pernicious anemia 09/08/2016  . Lumbar spondylosis 07/10/2013  . Hematuria of undiagnosed cause 04/02/2011     Janene Harvey, PT, DPT 07/20/19 2:04 PM   [facility] [address]  [place], [place], [address] Phone: [phone]   Fax:  [phone]  Name: Becky Warner MRN: [id number] Date of Birth: 1945/03/29

## 2019-07-25 ENCOUNTER — Other Ambulatory Visit: Payer: Self-pay

## 2019-07-25 ENCOUNTER — Encounter: Payer: Self-pay | Admitting: Sports Medicine

## 2019-07-25 ENCOUNTER — Encounter: Payer: Self-pay | Admitting: Physical Therapy

## 2019-07-25 ENCOUNTER — Ambulatory Visit: Payer: Medicare Other | Admitting: Sports Medicine

## 2019-07-25 ENCOUNTER — Ambulatory Visit: Payer: Medicare Other | Admitting: Physical Therapy

## 2019-07-25 DIAGNOSIS — M545 Low back pain, unspecified: Secondary | ICD-10-CM

## 2019-07-25 DIAGNOSIS — R29898 Other symptoms and signs involving the musculoskeletal system: Secondary | ICD-10-CM

## 2019-07-25 DIAGNOSIS — G8929 Other chronic pain: Secondary | ICD-10-CM

## 2019-07-25 DIAGNOSIS — R2689 Other abnormalities of gait and mobility: Secondary | ICD-10-CM

## 2019-07-25 NOTE — Therapy (Signed)
[facility] [address]  [address] [place], [place], [address] Phone: [phone]   Fax:  [phone]  Physical Therapy Treatment  Patient Details  Name: Becky Warner MRN: [id number] Date of Birth: Sep 17, 1945 Referring Provider (PT): Rodney Langton, MD   Encounter Date: 07/25/2019   PT End of Session - 07/25/19 1405    Visit Number 9    Number of Visits 13    Date for PT Re-Evaluation 08/02/19    Authorization Type Medicare & Medicaid    PT Start Time 1328   pt late   PT Stop Time 1400    PT Time Calculation (min) 32 min    Activity Tolerance Patient tolerated treatment well    Behavior During Therapy [facility] for tasks assessed/performed           Past Medical History:  Diagnosis Date  . Hypertension     Past Surgical History:  Procedure Laterality Date  . EXPLORATORY LAPAROTOMY    . HYSTEROTOMY    . ORIF FEMUR FRACTURE    . TUBAL LIGATION      There were no vitals filed for this visit.   Subjective Assessment - 07/25/19 1331    Subjective Found that her HEP was able to relieve the muscle spasms in her back on Sunday.    Pertinent History HTN, ORIF L femur fx    Diagnostic tests 06/02/19 lumbar spine: Mild degenerative disc disease/spondylosis from L3-S1; 06/17/19 lumbar MRI: Advanced lower lumbar facet arthrosis; No evidence of neural impingement.    Patient Stated Goals get rid of pain    Currently in Pain? No/denies                             [facility] Adult PT Treatment/Exercise - 07/25/19 0001      Lumbar Exercises: Stretches   Other Lumbar Stretch Exercise R/L QL stretch in doorway 30" each    Other Lumbar Stretch Exercise R/L diagonal prayer stretch with green pball 5x3" each      Lumbar Exercises: Aerobic   Nustep Lvl 4, 6 min (UEs/LEs)      Manual Therapy   Manual Therapy Soft tissue mobilization;Myofascial release    Manual therapy comments sidelying    Soft tissue  mobilization STM to B QL, lumbar paraspinals, proximal glutes, piriformis- TTP in B QL, R piriformis, B proximal glutes    Myofascial Release manual TPR to B QL, glutes, R piriformis                  PT Education - 07/25/19 1407    Education Details update to HEP    Person(s) Educated Patient    Methods Explanation;Demonstration;Tactile cues;Verbal cues;Handout    Comprehension Verbalized understanding;Returned demonstration            PT Short Term Goals - 07/06/19 1332      PT SHORT TERM GOAL #1   Title Patient to be independent with initial HEP.    Time 3    Period Weeks    Status Achieved    Target Date 07/12/19             PT Long Term Goals - 06/26/19 1355      PT LONG TERM GOAL #1   Title Patient to be independent with advanced HEP.    Time 6    Period Weeks    Status On-going      PT LONG  TERM GOAL #2   Title Patient to demonstrate B hip strength >/=4+/5.    Time 6    Period Weeks    Status On-going      PT LONG TERM GOAL #3   Title Patient to demonstrate mild tightness in B piriformis and hip flexor/quads remaining.    Time 6    Period Weeks    Status On-going      PT LONG TERM GOAL #4   Title Patient to demonstrate proper lifting mechanics to lift 20lbs from ground to counter with 0/10 pain.    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    Time 6    Period Weeks    Status On-going                 Plan - 07/25/19 1406    Clinical Impression Statement Patient reported relief of muscle spasms in her back after performing HEP on Sunday. Provided MT to improve soft tissue restriction and tenderness in B QL, lumbar paraspinals, proximal glutes, piriformis. Patient with palpable trigger points most evident in B QL, proximal glutes, and R piriformis. Followed with gentle QL stretching with patient tolerating this well. Updated HEP  with exercises that were well-tolerated today. Patient reported "I feel so much better when I leave here" at end of session.    Comorbidities HTN, ORIF femur fx    Rehab Potential Good    PT Frequency 2x / week    PT Treatment/Interventions ADLs/Self Care Home Management;Cryotherapy;Electrical Stimulation;Moist Heat;Traction;Balance training;Therapeutic exercise;Therapeutic activities;Functional mobility training;Stair training;Gait training;Ultrasound;Neuromuscular re-education;Patient/family education;Manual techniques;Taping;Energy conservation;Dry needling;Passive range of motion    PT Next Visit Plan further posture with ADLs; lumbopelvic strengthening and postural awareness    Consulted and Agree with Plan of Care Patient           Patient will benefit from skilled therapeutic intervention in order to improve the following deficits and impairments:  Decreased activity tolerance, Decreased strength, Increased fascial restricitons, Pain, Decreased balance, Difficulty walking, Increased muscle spasms, Improper body mechanics, Decreased range of motion, Impaired flexibility, Postural dysfunction  Visit Diagnosis: Chronic bilateral low back pain without sciatica  Other symptoms and signs involving the musculoskeletal system  Other abnormalities of gait and mobility     Problem List Patient Active Problem List   Diagnosis Date Noted  . Anxiety 09/08/2016  . Depression 09/08/2016  . GERD (gastroesophageal reflux disease) 09/08/2016  . Hyperlipidemia 09/08/2016  . Osteoporosis 09/08/2016  . Pernicious anemia 09/08/2016  . Lumbar spondylosis 07/10/2013  . Hematuria of undiagnosed cause 04/02/2011     Janene Harvey, PT, DPT 07/25/19 2:08 PM   [facility] [address]  [place], [place], [address] Phone: [phone]   Fax:  [phone]  Name: Abiola Behring MRN: [id number] Date of Birth: 1945/04/08

## 2019-07-27 ENCOUNTER — Other Ambulatory Visit: Payer: Self-pay

## 2019-07-27 ENCOUNTER — Encounter: Payer: Self-pay | Admitting: Physical Therapy

## 2019-07-27 ENCOUNTER — Ambulatory Visit: Payer: Medicare Other | Admitting: Physical Therapy

## 2019-07-27 DIAGNOSIS — M545 Low back pain, unspecified: Secondary | ICD-10-CM

## 2019-07-27 DIAGNOSIS — R29898 Other symptoms and signs involving the musculoskeletal system: Secondary | ICD-10-CM

## 2019-07-27 DIAGNOSIS — R2689 Other abnormalities of gait and mobility: Secondary | ICD-10-CM

## 2019-07-27 NOTE — Therapy (Signed)
[facility] [address]  [place], [place], [address] Phone: [phone]   Fax:  [phone]  Physical Therapy Progress Note  Patient Details  Name: Brylee Berk MRN: [id number] Date of Birth: 1945-10-01 Referring Provider (PT): Aundria Mems, MD   Progress Note Reporting Period 06/21/19 to 07/27/19  See note below for Objective Data and Assessment of Progress/Goals.    Encounter Date: 07/27/2019   PT End of Session - 07/27/19 1748    Visit Number 10    Number of Visits 22    Date for PT Re-Evaluation 09/07/19    Authorization Type Medicare & Medicaid    PT Start Time 1319    PT Stop Time 1359    PT Time Calculation (min) 40 min    Activity Tolerance Patient tolerated treatment well    Behavior During Therapy WFL for tasks assessed/performed           Past Medical History:  Diagnosis Date   Hypertension     Past Surgical History:  Procedure Laterality Date   EXPLORATORY LAPAROTOMY     HYSTEROTOMY     ORIF FEMUR FRACTURE     TUBAL LIGATION      There were no vitals filed for this visit.   Subjective Assessment - 07/27/19 1313    Subjective Had severe cramps in her back while working in the yard. Took 2 muscle relaxants to prevent it this AM. Notes 60% improvement in LBP since initial eval.    Pertinent History HTN, ORIF L femur fx    Diagnostic tests 06/02/19 lumbar spine: Mild degenerative disc disease/spondylosis from L3-S1; 06/17/19 lumbar MRI: Advanced lower lumbar facet arthrosis; No evidence of neural impingement.    Patient Stated Goals get rid of pain    Currently in Pain? No/denies              [facility] PT Assessment - 07/27/19 0001      Assessment   Medical Diagnosis Lumbar spondylosis    Referring Provider (PT) Aundria Mems, MD    Onset Date/Surgical Date 06/20/16      Strength   Right Hip Flexion 4+/5    Right Hip Extension 4+/5    Right Hip External  Rotation  4+/5    Right Hip Internal Rotation 4+/5    Right Hip ABduction 4+/5    Right Hip ADduction 4+/5    Left Hip Flexion 4+/5    Left Hip Extension 4+/5    Left Hip External Rotation 4/5    Left Hip Internal Rotation 4+/5    Left Hip ABduction 4+/5    Left Hip ADduction 4+/5      Flexibility   Quadriceps mildly tight on B LEs with mod thomas stretch    Piriformis L moderately tight with fig 4 and KTOS, mildly on R LE                         [facility] Adult PT Treatment/Exercise - 07/27/19 0001      Self-Care   Self-Care Lifting    Lifting 5x lifting 20# up to chest with cueing to avoid twisting and keeping object close to chest      Lumbar Exercises: Stretches   Hip Flexor Stretch Right;Left;1 rep;30 seconds    Hip Flexor Stretch Limitations supine with strap      Lumbar Exercises: Aerobic   Nustep Lvl 4, 6 min (UEs/LEs)  PT Education - 07/27/19 1747    Education Details update to HEP; discussion on objective progress and remaining impairments    Person(s) Educated Patient    Methods Explanation;Demonstration;Tactile cues;Verbal cues;Handout    Comprehension Verbalized understanding;Returned demonstration            PT Short Term Goals - 07/27/19 1324      PT SHORT TERM GOAL #1   Title Patient to be independent with initial HEP.    Time 3    Period Weeks    Status Achieved    Target Date 07/12/19             PT Long Term Goals - 07/27/19 1324      PT LONG TERM GOAL #1   Title Patient to be independent with advanced HEP.    Time 6    Period Weeks    Status Partially Met   met for current   Target Date 09/07/19      PT LONG TERM GOAL #2   Title Patient to demonstrate B hip strength >/=4+/5.    Time 6    Period Weeks    Status Partially Met   limited in L hip ER   Target Date 09/07/19      PT LONG TERM GOAL #3   Title Patient to demonstrate mild tightness in B piriformis and hip flexor/quads remaining.     Time 6    Period Weeks    Status On-going   unchanged   Target Date 09/07/19      PT LONG TERM GOAL #4   Title Patient to demonstrate proper lifting mechanics to lift 20lbs from ground to counter with 0/10 pain.    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    Time 6    Period Weeks    Status On-going   reports having to take breaks every 30 min d/t pain   Target Date 09/07/19                 Plan - 07/27/19 1749    Clinical Impression Statement Patient reports 60% improvement in LBP since initial eval. Still having trouble with muscle spasms in the LB when working in the yard or cleaning for 30 minutes at a time. Strength testing revealed improvement in B hip extension and IR, with L hip ER now most limiting. Flexibility unchanged, as patient still demonstrating mild tightness in B hip flexors with mod Thomas position and moderate/mild tightness in B piriformis. Reviewed proper lifting mechanics with cueing to avoid twisting when moving as object and keeping object close to BOS when lifting. Patient was able to lift a 20lbs box with good body mechanics and no pain. Updated HEP and reviewed this with patient. All questions answered and patient without complaints at end of session. Patient is tolerating session well, but with remaining LE tightness and pain with ADLs. Would benefit from additional skilled PT services 2x/week for 6 weeks to address remaining goals.    Comorbidities HTN, ORIF femur fx    Rehab Potential Good    PT Frequency 2x / week    PT Duration 6 weeks    PT Treatment/Interventions ADLs/Self Care Home Management;Cryotherapy;Electrical Stimulation;Moist Heat;Traction;Balance training;Therapeutic exercise;Therapeutic activities;Functional mobility training;Stair training;Gait training;Ultrasound;Neuromuscular re-education;Patient/family education;Manual  techniques;Taping;Energy conservation;Dry needling;Passive range of motion    PT  Next Visit Plan fig 4 and mod thomas stretching; further posture with ADLs; lumbopelvic strengthening and postural awareness    Consulted and Agree with Plan of Care Patient           Patient will benefit from skilled therapeutic intervention in order to improve the following deficits and impairments:  Decreased activity tolerance, Decreased strength, Increased fascial restricitons, Pain, Decreased balance, Difficulty walking, Increased muscle spasms, Improper body mechanics, Decreased range of motion, Impaired flexibility, Postural dysfunction  Visit Diagnosis: Chronic bilateral low back pain without sciatica  Other symptoms and signs involving the musculoskeletal system  Other abnormalities of gait and mobility     Problem List Patient Active Problem List   Diagnosis Date Noted   Anxiety 09/08/2016   Depression 09/08/2016   GERD (gastroesophageal reflux disease) 09/08/2016   Hyperlipidemia 09/08/2016   Osteoporosis 09/08/2016   Pernicious anemia 09/08/2016   Lumbar spondylosis 07/10/2013   Hematuria of undiagnosed cause 04/02/2011    Janene Harvey, PT, DPT 07/27/19 5:57 PM   [facility] [address]  [place], [place], [address] Phone: [phone]   Fax:  [phone]  Name: Alera Quevedo MRN: [id number] Date of Birth: 1945-11-12

## 2019-07-28 ENCOUNTER — Encounter: Payer: Medicare Other | Admitting: Physical Therapy

## 2019-08-01 ENCOUNTER — Encounter: Payer: Self-pay | Admitting: Physical Therapy

## 2019-08-01 ENCOUNTER — Ambulatory Visit: Payer: Medicare Other | Admitting: Physical Therapy

## 2019-08-01 ENCOUNTER — Other Ambulatory Visit: Payer: Self-pay

## 2019-08-01 DIAGNOSIS — R2689 Other abnormalities of gait and mobility: Secondary | ICD-10-CM

## 2019-08-01 DIAGNOSIS — M545 Low back pain, unspecified: Secondary | ICD-10-CM

## 2019-08-01 DIAGNOSIS — R29898 Other symptoms and signs involving the musculoskeletal system: Secondary | ICD-10-CM

## 2019-08-01 NOTE — Therapy (Addendum)
[facility] [address]  [place], [place], [address] Phone: [phone]   Fax:  [phone]  Physical Therapy Treatment  Patient Details  Name: Becky Warner MRN: [id number] Date of Birth: 11/06/1945 Referring Provider (PT): Aundria Mems, MD   Progress Note Reporting Period 08/01/19 to 08/01/19  See note below for Objective Data and Assessment of Progress/Goals.     Encounter Date: 08/01/2019   PT End of Session - 08/01/19 1350    Visit Number 11    Number of Visits 22    Date for PT Re-Evaluation 09/07/19    Authorization Type Medicare & Medicaid    PT Start Time 1304    PT Stop Time 1349    PT Time Calculation (min) 45 min    Activity Tolerance Patient tolerated treatment well    Behavior During Therapy WFL for tasks assessed/performed           Past Medical History:  Diagnosis Date  . Hypertension     Past Surgical History:  Procedure Laterality Date  . EXPLORATORY LAPAROTOMY    . HYSTEROTOMY    . ORIF FEMUR FRACTURE    . TUBAL LIGATION      There were no vitals filed for this visit.   Subjective Assessment - 08/01/19 1307    Subjective Has been taking care of her dog who stepped in poison oak.    Pertinent History HTN, ORIF L femur fx    Diagnostic tests 06/02/19 lumbar spine: Mild degenerative disc disease/spondylosis from L3-S1; 06/17/19 lumbar MRI: Advanced lower lumbar facet arthrosis; No evidence of neural impingement.    Patient Stated Goals get rid of pain    Currently in Pain? No/denies                             [facility] Adult PT Treatment/Exercise - 08/01/19 0001      Lumbar Exercises: Stretches   Passive Hamstring Stretch Right;1 rep;30 seconds    Passive Hamstring Stretch Limitations supine with strap   to relieve HS cramp   Figure 4 Stretch 1 rep;30 seconds    Figure 4 Stretch Limitations each LE; cues to maintain stretch instead of bouncing       Lumbar Exercises: Aerobic   Nustep Lvl 4, 6 min (UEs/LEs)      Lumbar Exercises: Standing   Wall Slides 10 reps    Wall Slides Limitations cues to shift R and avoid excessively deep squat    Other Standing Lumbar Exercises sidestepping with yellow TB around toes 4xlength of counter   compensatory hip Er     Lumbar Exercises: Seated   Other Seated Lumbar Exercises sitting pelvic tilts x10   heavy cueing for proper movement     Lumbar Exercises: Supine   Bridge 10 reps    Bridge Limitations straight leg bridge on peanut ball    Bridge with March 10 reps   cueing to lower hips after 2 LE movements     Lumbar Exercises: Sidelying   Clam Right;Left;10 reps    Clam Limitations red TB      Lumbar Exercises: Quadruped   Madcat/Old Horse 5 reps    Madcat/Old Horse Limitations discontinued d/t difficulty with proper form                  PT Education - 08/01/19 1350    Education Details update to [employer]) Educated  Patient    Methods Explanation;Demonstration;Tactile cues;Verbal cues;Handout    Comprehension Verbalized understanding;Returned demonstration            PT Short Term Goals - 07/27/19 1324      PT SHORT TERM GOAL #1   Title Patient to be independent with initial HEP.    Time 3    Period Weeks    Status Achieved    Target Date 07/12/19             PT Long Term Goals - 07/27/19 1324      PT LONG TERM GOAL #1   Title Patient to be independent with advanced HEP.    Time 6    Period Weeks    Status Partially Met   met for current   Target Date 09/07/19      PT LONG TERM GOAL #2   Title Patient to demonstrate B hip strength >/=4+/5.    Time 6    Period Weeks    Status Partially Met   limited in L hip ER   Target Date 09/07/19      PT LONG TERM GOAL #3   Title Patient to demonstrate mild tightness in B piriformis and hip flexor/quads remaining.    Time 6    Period Weeks    Status On-going   unchanged   Target Date 09/07/19      PT  LONG TERM GOAL #4   Title Patient to demonstrate proper lifting mechanics to lift 20lbs from ground to counter with 0/10 pain.    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    Time 6    Period Weeks    Status On-going   reports having to take breaks every 30 min d/t pain   Target Date 09/07/19                 Plan - 08/01/19 1351    Clinical Impression Statement Patient without new complaints today. Worked on progressive hip and core strengthening ther-ex today. Patient with slight challenge performing marching bridge d/t weakness, better ability to perform straight leg bridges. Demonstrated good form with banded clamshells. Patient required education on avoiding "bouncing" and instead promoting a prolonged hold- reported understanding after edu given.  Attempted gentle lumbopelvic ROM in quadruped which patient had difficulty with; slightly better carryover in sitting. Patient reported understanding of HEP update and noted "this relieves my stress" at end of session.    Comorbidities HTN, ORIF femur fx    Rehab Potential Good    PT Frequency 2x / week    PT Duration 6 weeks    PT Treatment/Interventions ADLs/Self Care Home Management;Cryotherapy;Electrical Stimulation;Moist Heat;Traction;Balance training;Therapeutic exercise;Therapeutic activities;Functional mobility training;Stair training;Gait training;Ultrasound;Neuromuscular re-education;Patient/family education;Manual techniques;Taping;Energy conservation;Dry needling;Passive range of motion    PT Next Visit Plan fig 4 and mod thomas stretching; further posture with ADLs; lumbopelvic strengthening and postural awareness    Consulted and Agree with Plan of Care Patient           Patient will benefit from skilled therapeutic intervention in order to improve the following deficits and impairments:  Decreased activity  tolerance, Decreased strength, Increased fascial restricitons, Pain, Decreased balance, Difficulty walking, Increased muscle spasms, Improper body mechanics, Decreased range of motion, Impaired flexibility, Postural dysfunction  Visit Diagnosis: Chronic bilateral low back pain without  sciatica  Other symptoms and signs involving the musculoskeletal system  Other abnormalities of gait and mobility     Problem List Patient Active Problem List   Diagnosis Date Noted  . Anxiety 09/08/2016  . Depression 09/08/2016  . GERD (gastroesophageal reflux disease) 09/08/2016  . Hyperlipidemia 09/08/2016  . Osteoporosis 09/08/2016  . Pernicious anemia 09/08/2016  . Lumbar spondylosis 07/10/2013  . Hematuria of undiagnosed cause 04/02/2011    Janene Harvey, PT, DPT 08/01/19 1:54 PM   [facility] [address]  [place], [place], [address] Phone: [phone]   Fax:  [phone]  Name: Becky Warner MRN: [id number] Date of Birth: 01/01/1946   PHYSICAL THERAPY DISCHARGE SUMMARY  Visits from Start of Care: 11  Current functional level related to goals / functional outcomes: Unable to assess; patient did not return, noting that she was released from referring MD's care   Remaining deficits: Unable to assess   Education / Equipment: HEP  Plan: Patient agrees to discharge.  Patient goals were partially met. Patient is being discharged due to the patient's request.  ?????     Janene Harvey, PT, DPT 09/19/19 1:42 PM

## 2019-08-03 ENCOUNTER — Ambulatory Visit: Payer: Medicare Other | Attending: Sports Medicine | Admitting: Physical Therapy

## 2019-08-15 ENCOUNTER — Ambulatory Visit: Payer: Medicare Other

## 2019-08-17 ENCOUNTER — Ambulatory Visit: Payer: Medicare Other

## 2019-08-22 ENCOUNTER — Ambulatory Visit: Payer: Medicare Other

## 2019-08-24 ENCOUNTER — Encounter: Payer: Medicare Other | Admitting: Physical Therapy

## 2019-08-29 ENCOUNTER — Encounter: Payer: Medicare Other | Admitting: Physical Therapy

## 2019-11-24 ENCOUNTER — Other Ambulatory Visit: Payer: Self-pay | Admitting: Sports Medicine

## 2019-11-24 DIAGNOSIS — M47816 Spondylosis without myelopathy or radiculopathy, lumbar region: Secondary | ICD-10-CM

## 2019-11-25 ENCOUNTER — Other Ambulatory Visit: Payer: Self-pay | Admitting: Sports Medicine

## 2019-11-25 DIAGNOSIS — M47816 Spondylosis without myelopathy or radiculopathy, lumbar region: Secondary | ICD-10-CM

## 2019-11-27 ENCOUNTER — Other Ambulatory Visit: Payer: Self-pay | Admitting: Sports Medicine

## 2019-11-27 DIAGNOSIS — M47816 Spondylosis without myelopathy or radiculopathy, lumbar region: Secondary | ICD-10-CM

## 2019-12-03 ENCOUNTER — Other Ambulatory Visit: Payer: Self-pay | Admitting: Sports Medicine

## 2019-12-03 DIAGNOSIS — M47816 Spondylosis without myelopathy or radiculopathy, lumbar region: Secondary | ICD-10-CM

## 2020-09-21 ENCOUNTER — Emergency Department (INDEPENDENT_AMBULATORY_CARE_PROVIDER_SITE_OTHER): Payer: Medicare Other

## 2020-09-21 ENCOUNTER — Emergency Department (INDEPENDENT_AMBULATORY_CARE_PROVIDER_SITE_OTHER)
Admission: EM | Admit: 2020-09-21 | Discharge: 2020-09-21 | Disposition: A | Discharge status: Home / Self Care | Payer: Medicare Other | Source: Home / Self Care | Attending: Family Medicine | Admitting: Family Medicine

## 2020-09-21 ENCOUNTER — Encounter: Payer: Self-pay | Admitting: Emergency Medicine

## 2020-09-21 ENCOUNTER — Other Ambulatory Visit: Payer: Self-pay

## 2020-09-21 DIAGNOSIS — R0989 Other specified symptoms and signs involving the circulatory and respiratory systems: Secondary | ICD-10-CM | POA: Diagnosis not present

## 2020-09-21 DIAGNOSIS — F17200 Nicotine dependence, unspecified, uncomplicated: Secondary | ICD-10-CM

## 2020-09-21 DIAGNOSIS — R5383 Other fatigue: Secondary | ICD-10-CM

## 2020-09-21 DIAGNOSIS — J22 Unspecified acute lower respiratory infection: Secondary | ICD-10-CM | POA: Diagnosis not present

## 2020-09-21 DIAGNOSIS — Z20822 Contact with and (suspected) exposure to covid-19: Secondary | ICD-10-CM | POA: Diagnosis not present

## 2020-09-21 DIAGNOSIS — R0602 Shortness of breath: Secondary | ICD-10-CM

## 2020-09-21 LAB — POC SARS CORONAVIRUS 2 AG -  ED: SARS Coronavirus 2 Ag: NEGATIVE

## 2020-09-21 MED ORDER — METHYLPREDNISOLONE SODIUM SUCC 125 MG IJ SOLR: 125.0000 mg | Freq: Once | INTRAMUSCULAR | Status: DC

## 2020-09-21 MED ORDER — AZITHROMYCIN 250 MG PO TABS: 250.0000 mg | ORAL_TABLET | Freq: Every day | ORAL | 0 refills | Status: DC

## 2020-09-21 MED ORDER — BENZONATATE 200 MG PO CAPS: 200.0000 mg | ORAL_CAPSULE | Freq: Two times a day (BID) | ORAL | 0 refills | Status: DC | PRN

## 2020-09-21 MED ORDER — ALBUTEROL SULFATE HFA 108 (90 BASE) MCG/ACT IN AERS
2.0000 | INHALATION_SPRAY | Freq: Once | RESPIRATORY_TRACT | Status: AC
  Administered 2020-09-21: 2 via RESPIRATORY_TRACT

## 2020-09-21 MED ORDER — PREDNISONE 20 MG PO TABS: 20.0000 mg | ORAL_TABLET | Freq: Two times a day (BID) | ORAL | 0 refills | Status: DC

## 2020-09-21 MED ORDER — METHYLPREDNISOLONE SODIUM SUCC 125 MG IJ SOLR
125.0000 mg | Freq: Once | INTRAMUSCULAR | Status: AC
  Administered 2020-09-21: 125 mg via INTRAMUSCULAR

## 2020-09-21 MED ORDER — DM-GUAIFENESIN ER 30-600 MG PO TB12: 1.0000 | ORAL_TABLET | Freq: Two times a day (BID) | ORAL | 0 refills | Status: DC

## 2020-09-21 NOTE — Discharge Instructions (Signed)
Take the prednisone 2 times a day.  Take 2 doses today Take azithromycin on schedule.  2 pills today then 1 a day until gone There are 2 medicines for your cough and congestion.  1 is Mucinex DM (generic) and the other 1 is benzonatate.  Take 1 pill of each every 12 hours Drink lots of fluids and get lots of rest No work for another 3 or 4 days

## 2020-09-21 NOTE — ED Triage Notes (Signed)
Patient c/o SOB, chest congestion, wheezing, productive cough x 1 week.  Patient has been exposed to COVID.  Patient has been taken Mucinex and headache meds.  Patient has been vaccinated for COVID.

## 2020-09-22 NOTE — ED Provider Notes (Signed)
[facility]    CSN: [id number] Arrival date & time: 09/21/20  1059      History   Chief Complaint Chief Complaint  Patient presents with   Cough    HPI   Patient is here for cough, fatigue, and shortness of breath.  She has had symptoms for over a week.  She is a long-term smoker but states she does not have any underlying asthma or COPD.  Prior to her symptoms she was exposed to COVID by her daughter and grandchildren.  She has not done any COVID testing.  She has not had any COVID vaccinations.  She denies headache, fever, or chills but does state that she has achiness and significant fatigue.   Becky Warner is a 75 y.o. Past Medical History:  Diagnosis Date   Hypertension     Patient Active Problem List   Diagnosis Date Noted   Anxiety 09/08/2016   Depression 09/08/2016   GERD (gastroesophageal reflux disease) 09/08/2016   Hyperlipidemia 09/08/2016   Osteoporosis 09/08/2016   Pernicious anemia 09/08/2016   Lumbar spondylosis 07/10/2013   Hematuria of undiagnosed cause 04/02/2011    Past Surgical History:  Procedure Laterality Date   EXPLORATORY LAPAROTOMY     HYSTEROTOMY     ORIF FEMUR FRACTURE     TUBAL LIGATION      OB History     Gravida  3   Para  3   Term      Preterm      AB      Living  3      SAB      IAB      Ectopic      Multiple      Live Births               Home Medications    Prior to Admission medications   Medication Sig Start Date End Date Taking? Authorizing Provider  azithromycin (ZITHROMAX) 250 MG tablet Take 1 tablet (250 mg total) by mouth daily. Take first 2 tablets together, then 1 every day until finished. 09/21/20  Yes Eustace MooreNelson, Ellorie Kindall Sue, MD  benzonatate (TESSALON) 200 MG capsule Take 1 capsule (200 mg total) by mouth 2 (two) times daily as needed for cough. 09/21/20  Yes Eustace MooreNelson, Reco Shonk Sue, MD  dextromethorphan-guaiFENesin [facility](MUCINEX DM) 30-600 MG 12hr tablet Take 1 tablet by mouth 2 (two)  times daily. 09/21/20  Yes Eustace MooreNelson, Kailiana Granquist Sue, MD  predniSONE (DELTASONE) 20 MG tablet Take 1 tablet (20 mg total) by mouth 2 (two) times daily with a meal. 09/21/20  Yes Eustace MooreNelson, Addi Pak Sue, MD  albuterol (PROAIR HFA) 108 (90 Base) MCG/ACT inhaler INHALE 1 PUFF INTO THE LUNGS EVERY 6 (SIX) HOURS AS NEEDED FOR WHEEZING OR SHORTNESS OF BREATH. 06/14/15   [provider]  ALPRAZolam Prudy Feeler(XANAX) 1 MG tablet Take 1 mg by mouth 2 (two) times daily. 09/03/16   [provider]  amLODipine (NORVASC) 5 MG tablet Take 10 mg by mouth.    [provider]  calcium carbonate (TUMS EX) 750 MG chewable tablet Chew 750 mg by mouth.    [provider]  estradiol (ESTRACE) 1 MG tablet Take 1 mg by mouth.    [provider]  HYDROcodone-acetaminophen (NORCO/VICODIN) 5-325 MG tablet Take 1 tablet by mouth every 6 (six) hours as needed. 05/13/19   [provider]  Multiple Vitamin (MULTIVITAMIN) capsule Take by mouth.    [provider]  nystatin (MYCOSTATIN/NYSTOP) powder Apply topically 4 (  four) times daily. 09/08/16   Reva Bores, MD  omeprazole (PRILOSEC) 40 MG capsule TAKE ONE CAPSULE (40 MG TOTAL) BY MOUTH DAILY. 08/06/16   [provider]  pravastatin (PRAVACHOL) 40 MG tablet Take by mouth. 04/20/16   [provider]  pregabalin (LYRICA) 100 MG capsule Take 1 capsule (100 mg total) by mouth 2 (two) times daily. 06/13/19   Monica Becton, MD  venlafaxine XR (EFFEXOR-XR) 75 MG 24 hr capsule Take 75 mg by mouth daily. 12/30/18   [provider]  vitamin C (ASCORBIC ACID) 500 MG tablet Take 500 mg by mouth.    [provider]    Family History No family history on file.  Social History Social History   Tobacco Use   Smoking status: Every Day   Smokeless tobacco: Current  Substance Use Topics   Alcohol use: No   Drug use: No     Allergies   Penicillins, Sulfa antibiotics, and Sulfamethoxazole   Review of  Systems Review of Systems See HPI  Physical Exam Triage Vital Signs ED Triage Vitals  Enc Vitals Group     BP 09/21/20 1146 121/70     Pulse Rate 09/21/20 1146 68     Resp 09/21/20 1146 (!) 24     Temp 09/21/20 1146 98.3 F (36.8 C)     Temp Source 09/21/20 1146 Oral     SpO2 09/21/20 1146 94 %     Weight 09/21/20 1148 195 lb (88.5 kg)     Height 09/21/20 1148 5\' 7"  (1.702 m)     Head Circumference --      Peak Flow --      Pain Score 09/21/20 1320 0     Pain Loc --      Pain Edu? --      Excl. in [place]? --    No data found.  Updated Vital Signs BP 121/70 (BP Location: Right Arm)   Pulse 68   Temp 98.3 F (36.8 C) (Oral)   Resp (!) 24   Ht 5\' 7"  (1.702 m)   Wt 88.5 kg   SpO2 94%   BMI 30.54 kg/m      Physical Exam Constitutional:      General: She is in acute distress.     Appearance: She is well-developed and normal weight. She is ill-appearing.     Comments: Patient appears very tired.  Tachypneic.  Worse with exertion  HENT:     Head: Normocephalic and atraumatic.     Right Ear: Tympanic membrane and ear canal normal.     Left Ear: Tympanic membrane and ear canal normal.     Nose: Nose normal. No congestion.     Mouth/Throat:     Mouth: Mucous membranes are moist.     Pharynx: No posterior oropharyngeal erythema.  Eyes:     Conjunctiva/sclera: Conjunctivae normal.     Pupils: Pupils are equal, round, and reactive to light.  Cardiovascular:     Rate and Rhythm: Normal rate and regular rhythm.     Heart sounds: Normal heart sounds.  Pulmonary:     Effort: Pulmonary effort is normal. No respiratory distress.     Breath sounds: Wheezing and rhonchi present. No rales.     Comments: Patient has shallow breathing, diffuse wheezes, coarse rhonchi. Abdominal:     General: There is no distension.     Palpations: Abdomen is soft.  Musculoskeletal:        General: Normal range of  motion.     Cervical back: Normal range of motion.  Lymphadenopathy:      Cervical: No cervical adenopathy.  Skin:    General: Skin is warm and dry.  Neurological:     General: No focal deficit present.     Mental Status: She is alert.  Psychiatric:        Mood and Affect: Mood normal.        Behavior: Behavior normal.   Patient is reexamined after her shot of Solu-Medrol and her albuterol treatment.  She has better air movement, but still has wheezes throughout both lungs  UC Treatments / Results  Labs (all labs ordered are listed, but only abnormal results are displayed) Labs Reviewed  POC SARS CORONAVIRUS 2 AG -  ED  Rapid SARS is negative.  EKG   Radiology DG Chest 2 View  Result Date: 09/21/2020 CLINICAL DATA:  Shortness of breath, congestion and fatigue with recent COVID exposure in a 75 year old female. EXAM: CHEST - 2 VIEW COMPARISON:  None FINDINGS: Trachea is midline. Cardiomediastinal contours and hilar structures are normal. Lungs are clear.  No effusion.  No visible pneumothorax. IMPRESSION: No acute cardiopulmonary disease. Electronically Signed   By: Donzetta Kohut M.D.   On: 09/21/2020 12:41    Procedures Procedures (including critical care time)  Medications Ordered in UC Medications  albuterol (VENTOLIN HFA) 108 (90 Base) MCG/ACT inhaler 2 puff (2 puffs Inhalation Given 09/21/20 1214)  methylPREDNISolone sodium succinate (SOLU-MEDROL) 125 mg/2 mL injection 125 mg (125 mg Intramuscular Given 09/21/20 1258)    Initial Impression / Assessment and Plan / UC Course  I have reviewed the triage vital signs and the nursing notes.  Pertinent labs & imaging results that were available during my care of the patient were reviewed by me and considered in my medical decision making (see chart for details).     I explained to the patient that this could have been a COVID-19 infection initially, and she is testing negative today because it has been 7 to 8 days.  It would not be unusual to have continued coughing and shortness of breath after  COVID or another viral upper respiratory infection.  Even though not diagnosed, I believe she has some underlying COPD from 50 years of smoking.  She is advised to try to cut back or quit her smoking. Final Clinical Impressions(s) / UC Diagnoses   Final diagnoses:  LRTI (lower respiratory tract infection)  SOB (shortness of breath)  Tobacco dependence  Exposure to confirmed case of COVID-19     Discharge Instructions      Take the prednisone 2 times a day.  Take 2 doses today Take azithromycin on schedule.  2 pills today then 1 a day until gone There are 2 medicines for your cough and congestion.  1 is Mucinex DM (generic) and the other 1 is benzonatate.  Take 1 pill of each every 12 hours Drink lots of fluids and get lots of rest No work for another 3 or 4 days   ED Prescriptions     Medication Sig Dispense Auth. Provider   predniSONE (DELTASONE) 20 MG tablet Take 1 tablet (20 mg total) by mouth 2 (two) times daily with a meal. 10 tablet Eustace Moore, MD   azithromycin (ZITHROMAX) 250 MG tablet Take 1 tablet (250 mg total) by mouth daily. Take first 2 tablets together, then 1 every day until finished. 6 tablet Eustace Moore, MD   benzonatate (TESSALON) 200 MG capsule  Take 1 capsule (200 mg total) by mouth 2 (two) times daily as needed for cough. 20 capsule Eustace Moore, MD   dextromethorphan-guaiFENesin [facility] DM) 30-600 MG 12hr tablet Take 1 tablet by mouth 2 (two) times daily. 20 tablet Eustace Moore, MD      PDMP not reviewed this encounter.   Eustace Moore, MD 09/22/20 1011

## 2021-03-09 IMAGING — MR MR LUMBAR SPINE W/O CM
4 of 5 series · 26 of 48 positions shown · non-contrast
Comparison: Lumbar spine radiographs 06/02/2019

CLINICAL DATA: Chronic low back pain radiating down both legs with
weakness.

EXAM:
MRI LUMBAR SPINE WITHOUT CONTRAST
TECHNIQUE: Multiplanar, multisequence MR imaging of the lumbar spine was
performed. No intravenous contrast was administered.

[Series 3: T2 · sagittal · 4.0mm · 0.81mm/px · 6 of 15 slices shown (1 of 2)]
[im 1/15]
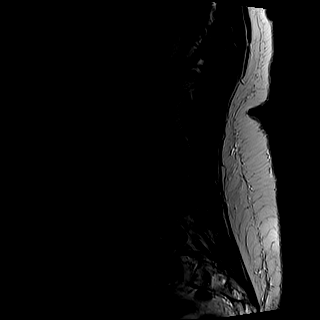
[im 3/15]
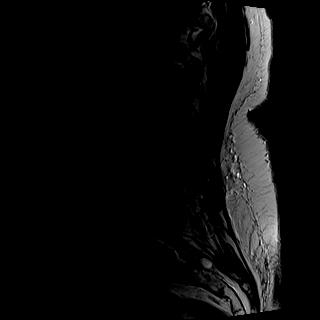
[im 6/15]
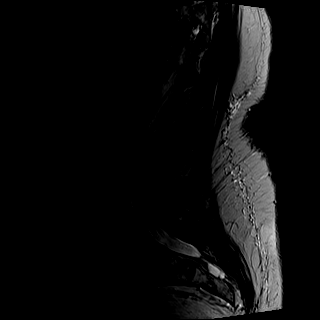
[im 9/15]
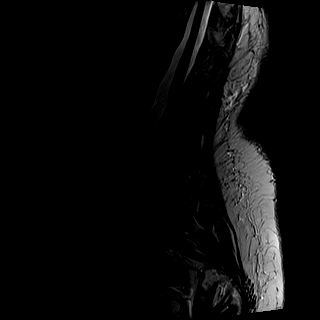
[im 12/15]
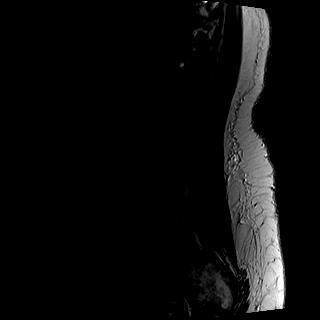
[im 15/15]
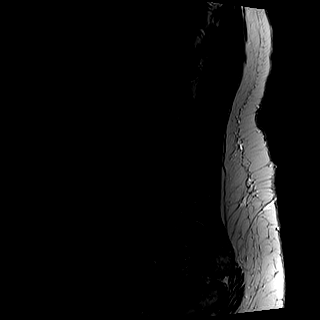

[Series 4: T1 · sagittal · 4.0mm · 0.41mm/px · 6 of 15 slices shown (1 of 2)]
[im 1/15]
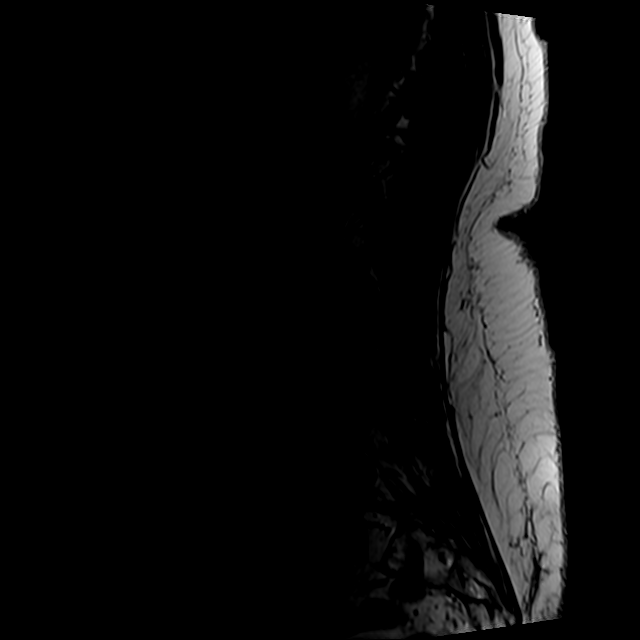
[im 3/15]
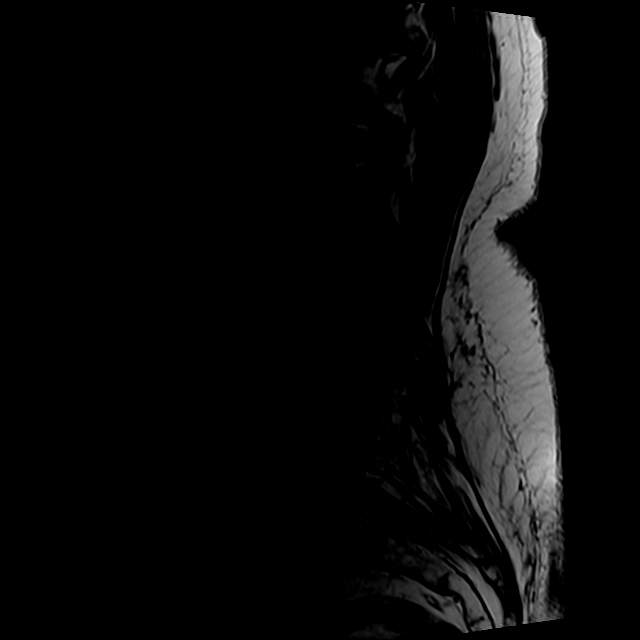
[im 6/15]
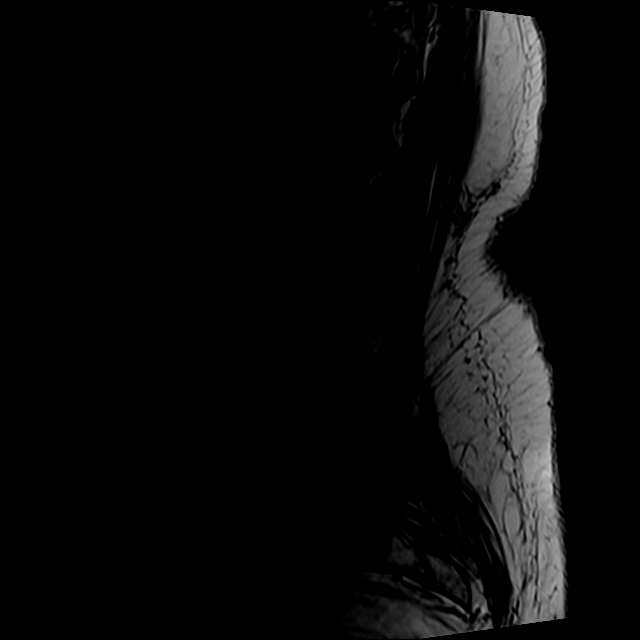
[im 9/15]
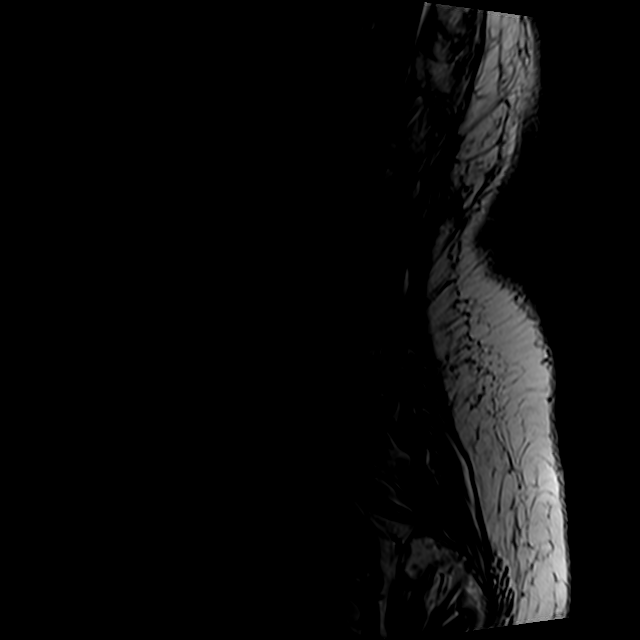
[im 12/15]
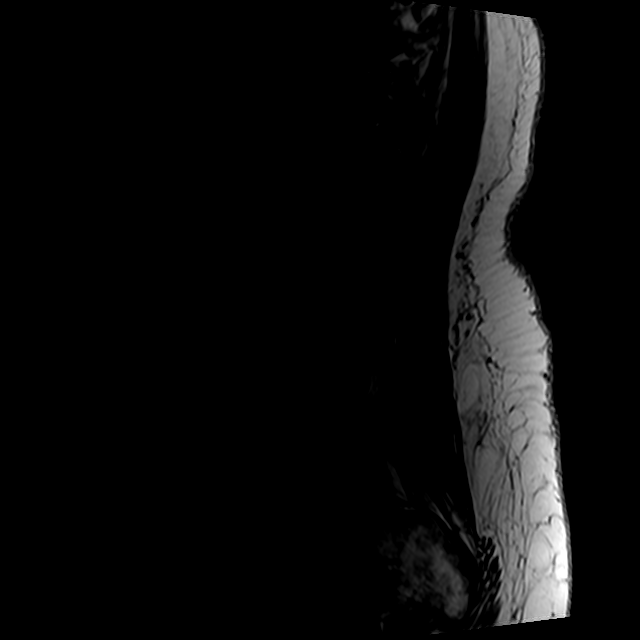
[im 15/15]
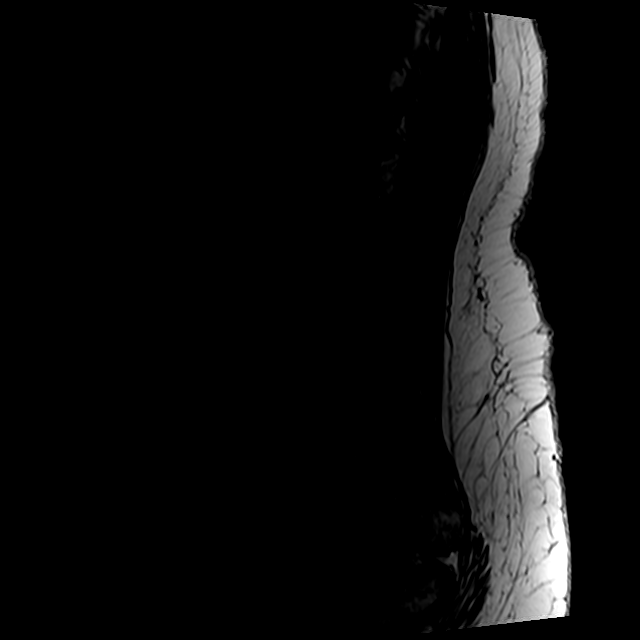

[Series 6: T2 · axial · 4.0mm · 0.78mm/px · z∈[-34,+178]mm · 9 of 41 slices shown (2 of 2)]
[im 1/41]
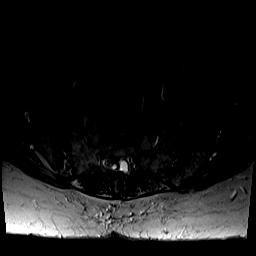
[im 6/41]
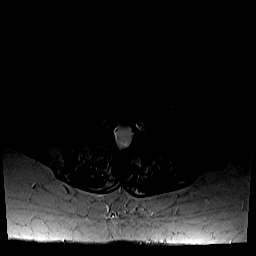
[im 12/41]
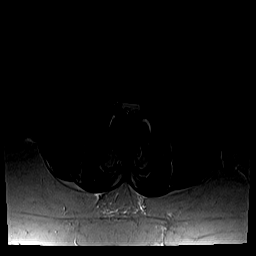
[im 18/41]
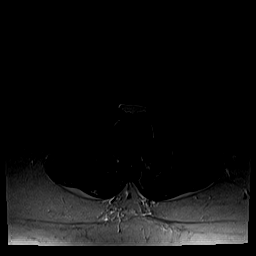
[im 21/41]
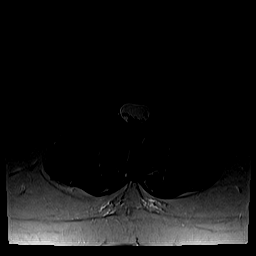
[im 23/41]
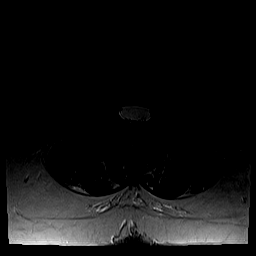
[im 29/41]
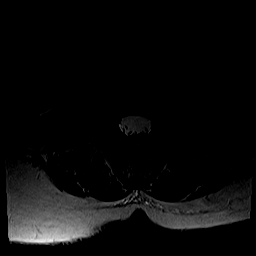
[im 35/41]
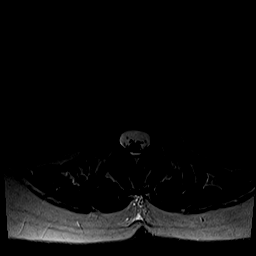
[im 41/41]
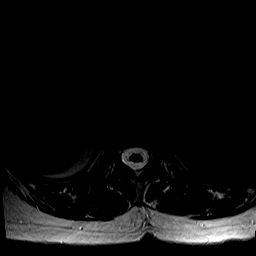

[Series 7: T1 · axial · 4.0mm · 0.39mm/px · z∈[-34,+148]mm · 5 of 41 slices shown (2 of 2)]
[im 1/41]
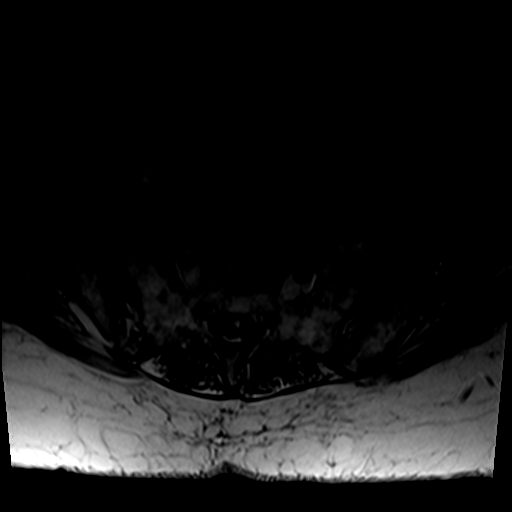
[im 6/41]
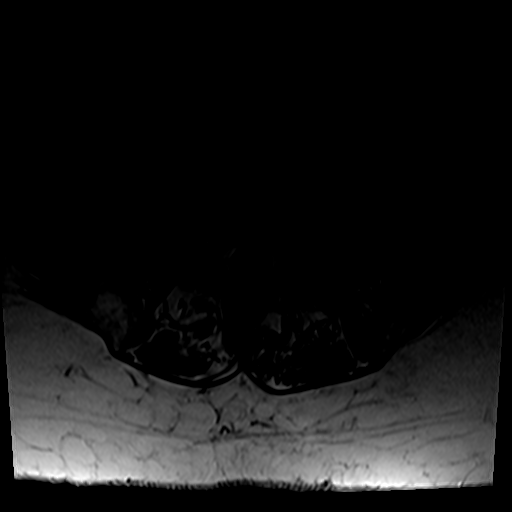
[im 12/41]
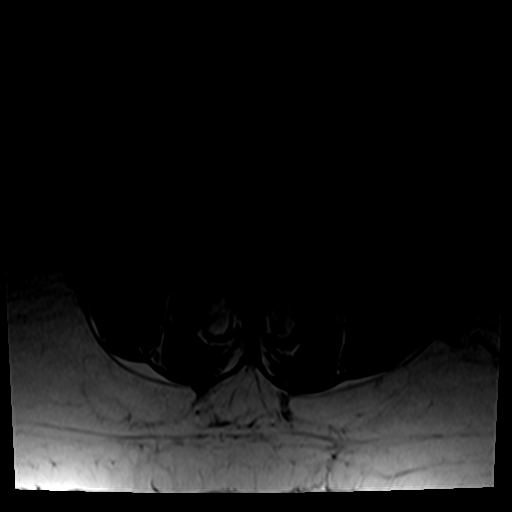
[im 21/41]
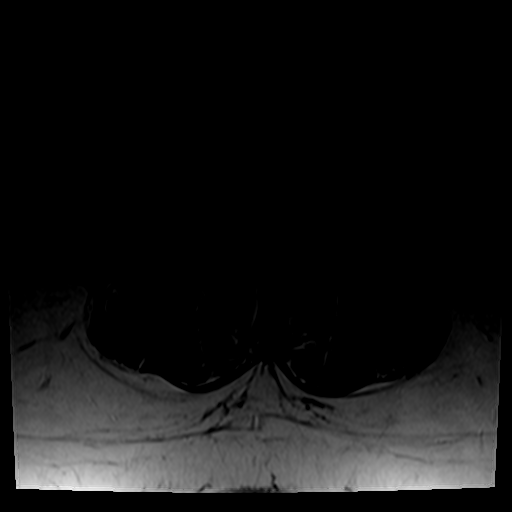
[im 35/41]
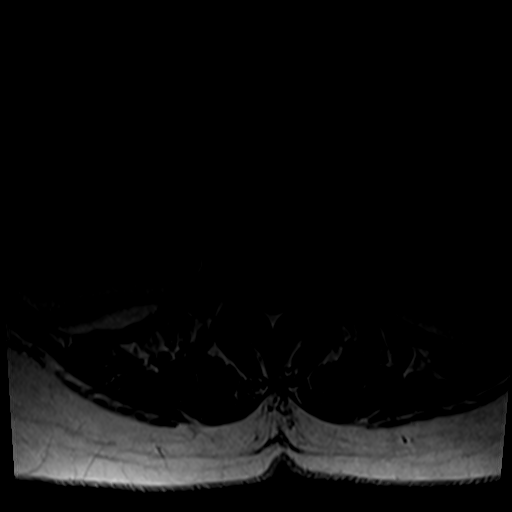

[26 of 48 positions shown; findings below may reference images not displayed]

FINDINGS: Segmentation:  Standard.

Alignment: Trace anterolisthesis of L4 on L5. Exaggerated lumbar
lordosis.

Vertebrae: No acute fracture or suspicious osseous lesion. Mild
chronic T12 superior endplate compression fracture. Minimal
degenerative endplate edema at L4-5.

Conus medullaris and cauda equina: Conus extends to the L1-2 level.
Conus and cauda equina appear normal.

Paraspinal and other soft tissues: Unremarkable.

Disc levels:

Disc desiccation throughout the lumbar spine. Slight disc space
narrowing at L4-5.

T12-L1 through L2-3: Negative.

L3-4: Minimal leftward disc bulging and moderate to severe facet and
ligamentum flavum hypertrophy without stenosis. Bilateral facet
joint effusions.

L4-5: Slight anterolisthesis with right eccentric bulging of
uncovered disc and moderate to severe facet and ligamentum flavum
hypertrophy result in borderline spinal stenosis and borderline
right lateral recess and right neural foraminal stenosis. Bilateral
facet joint effusions.

L5-S1: Moderate right and severe left facet hypertrophy without disc
herniation or stenosis.
IMPRESSION: 1. Advanced lower lumbar facet arthrosis.
2. No evidence of neural impingement.

## 2022-02-09 ENCOUNTER — Ambulatory Visit
Admission: EM | Admit: 2022-02-09 | Discharge: 2022-02-09 | Disposition: A | Discharge status: Home / Self Care | Payer: 59 | Attending: Family Medicine | Admitting: Family Medicine

## 2022-02-09 DIAGNOSIS — J22 Unspecified acute lower respiratory infection: Secondary | ICD-10-CM | POA: Diagnosis not present

## 2022-02-09 HISTORY — DX: Unspecified atrial fibrillation: I48.91

## 2022-02-09 MED ORDER — PREDNISONE 20 MG PO TABS: ORAL_TABLET | ORAL | 0 refills | Status: DC

## 2022-02-09 MED ORDER — BENZONATATE 200 MG PO CAPS: 200.0000 mg | ORAL_CAPSULE | Freq: Three times a day (TID) | ORAL | 0 refills | Status: AC | PRN

## 2022-02-09 MED ORDER — DOXYCYCLINE HYCLATE 100 MG PO CAPS: 100.0000 mg | ORAL_CAPSULE | Freq: Two times a day (BID) | ORAL | 0 refills | Status: AC

## 2022-02-09 NOTE — ED Provider Notes (Signed)
[facility]    CSN: [id number] Arrival date & time: 02/09/22  1440      History   Chief Complaint Chief Complaint  Patient presents with   Nasal Congestion   Wheezing    HPI Loree Lomibao is a 77 y.o. female.   HPI presents with nasal congestion, wheezing and fatigue that began 1 week ago.  PMH significant for morbid obesity, HTN, and atrial fibrillation.  Past Medical History:  Diagnosis Date   Atrial fibrillation [facility])    Hypertension     Patient Active Problem List   Diagnosis Date Noted   Anxiety 09/08/2016   Depression 09/08/2016   GERD (gastroesophageal reflux disease) 09/08/2016   Hyperlipidemia 09/08/2016   Osteoporosis 09/08/2016   Pernicious anemia 09/08/2016   Lumbar spondylosis 07/10/2013   Hematuria of undiagnosed cause 04/02/2011    Past Surgical History:  Procedure Laterality Date   EXPLORATORY LAPAROTOMY     HYSTEROTOMY     ORIF FEMUR FRACTURE     TUBAL LIGATION      OB History     Gravida  3   Para  3   Term      Preterm      AB      Living  3      SAB      IAB      Ectopic      Multiple      Live Births               Home Medications    Prior to Admission medications   Medication Sig Start Date End Date Taking? Authorizing Provider  benzonatate (TESSALON) 200 MG capsule Take 1 capsule (200 mg total) by mouth 3 (three) times daily as needed for up to 7 days. 02/09/22 02/16/22 Yes Eliezer Lofts, FNP  doxycycline (VIBRAMYCIN) 100 MG capsule Take 1 capsule (100 mg total) by mouth 2 (two) times daily for 7 days. 02/09/22 02/16/22 Yes Eliezer Lofts, FNP  predniSONE (DELTASONE) 20 MG tablet Take 3 tabs PO daily x 5 days. 02/09/22  Yes Eliezer Lofts, FNP  albuterol (PROAIR HFA) 108 (90 Base) MCG/ACT inhaler INHALE 1 PUFF INTO THE LUNGS EVERY 6 (SIX) HOURS AS NEEDED FOR WHEEZING OR SHORTNESS OF BREATH. 06/14/15   [provider]  ALPRAZolam Duanne Moron) 1 MG tablet Take 1 mg by mouth 2 (two) times daily. 09/03/16    [provider]  amLODipine (NORVASC) 5 MG tablet Take 10 mg by mouth.    [provider]  calcium carbonate (TUMS EX) 750 MG chewable tablet Chew 750 mg by mouth.    [provider]  estradiol (ESTRACE) 1 MG tablet Take 1 mg by mouth.    [provider]  Multiple Vitamin (MULTIVITAMIN) capsule Take by mouth.    [provider]  nystatin (MYCOSTATIN/NYSTOP) powder Apply topically 4 (four) times daily. 09/08/16   Donnamae Jude, MD  omeprazole (PRILOSEC) 40 MG capsule TAKE ONE CAPSULE (40 MG TOTAL) BY MOUTH DAILY. 08/06/16   [provider]  pravastatin (PRAVACHOL) 40 MG tablet Take by mouth. 04/20/16   [provider]  pregabalin (LYRICA) 100 MG capsule Take 1 capsule (100 mg total) by mouth 2 (two) times daily. 06/13/19   Silverio Decamp, MD  venlafaxine XR (EFFEXOR-XR) 75 MG 24 hr capsule Take 75 mg by mouth daily. 12/30/18   [provider]  vitamin C (ASCORBIC ACID) 500 MG tablet Take 500 mg by mouth.    [provider]    Family History History reviewed. No pertinent family history.  Social History Social History   Tobacco Use   Smoking status: Every Day   Smokeless tobacco: Current  Substance Use Topics   Alcohol use: No   Drug use: No     Allergies   Penicillins, Sulfa antibiotics, and Sulfamethoxazole   Review of Systems Review of Systems  Constitutional:  Positive for fatigue.  HENT:  Positive for congestion.   Respiratory:  Positive for wheezing.   All other systems reviewed and are negative.    Physical Exam Triage Vital Signs ED Triage Vitals [02/09/22 1448]  Enc Vitals Group     BP      Pulse      Resp      Temp      Temp src      SpO2      Weight      Height      Head Circumference      Peak Flow      Pain Score 0     Pain Loc      Pain Edu?      Excl. in [place]?    No data found.  Updated Vital Signs BP 137/82 (BP Location: Left Arm)   Pulse (!) 55   Temp 98  F (36.7 C) (Oral)   Resp 16   SpO2 95%   Visual Acuity Right Eye Distance:   Left Eye Distance:   Bilateral Distance:    Right Eye Near:   Left Eye Near:    Bilateral Near:     Physical Exam Vitals and nursing note reviewed.  Constitutional:      Appearance: Normal appearance. She is obese.  HENT:     Head: Normocephalic and atraumatic.     Right Ear: Tympanic membrane, ear canal and external ear normal.     Left Ear: Tympanic membrane, ear canal and external ear normal.     Mouth/Throat:     Mouth: Mucous membranes are moist.     Pharynx: Oropharynx is clear.  Eyes:     Extraocular Movements: Extraocular movements intact.     Conjunctiva/sclera: Conjunctivae normal.     Pupils: Pupils are equal, round, and reactive to light.  Cardiovascular:     Rate and Rhythm: Normal rate and regular rhythm.     Pulses: Normal pulses.     Heart sounds: Normal heart sounds.  Pulmonary:     Effort: Pulmonary effort is normal.     Breath sounds: Rhonchi present. No wheezing or rales.     Comments: Very mild diffuse scattered rhonchi with infrequent nonproductive cough noted Musculoskeletal:        General: Normal range of motion.     Cervical back: Normal range of motion and neck supple. No tenderness.  Lymphadenopathy:     Cervical: No cervical adenopathy.  Skin:    General: Skin is warm and dry.  Neurological:     General: No focal deficit present.     Mental Status: She is alert and oriented to person, place, and time.      UC Treatments / Results  Labs (all labs ordered are listed, but only abnormal results are displayed) Labs Reviewed - No data to display  EKG   Radiology No results found.  Procedures Procedures (including critical care time)  Medications Ordered in UC Medications - No data to display  Initial Impression / Assessment and Plan / UC Course  I have reviewed  the triage vital signs and the nursing notes.  Pertinent labs & imaging results that  were available during my care of the patient were reviewed by me and considered in my medical decision making (see chart for details).    MDM: 1. LRTI (lower respiratory tract infection)-Rx'd doxycycline, prednisone, Tessalon. Advised patient to take medications as directed with food to completion.  Advised patient to take prednisone with doxycycline for the next 5 of 7 days.  Advised may use Tessalon daily or as needed for cough.  Encouraged patient increase daily water intake while taking this medications. Advised if symptoms worsen and/or unresolved please follow-up with PCP or here for further evaluation.  Patient discharged home, hemodynamically stable. Final Clinical Impressions(s) / UC Diagnoses   Final diagnoses:  LRTI (lower respiratory tract infection)     Discharge Instructions      Advised patient to take medications as directed with food to completion.  Advised patient to take prednisone with doxycycline for the next 5 of 7 days.  Advised may use Tessalon daily or as needed for cough.  Encouraged patient increase daily water intake while taking this medications.     ED Prescriptions     Medication Sig Dispense Auth. Provider   doxycycline (VIBRAMYCIN) 100 MG capsule Take 1 capsule (100 mg total) by mouth 2 (two) times daily for 7 days. 14 capsule Trevor Iha, FNP   predniSONE (DELTASONE) 20 MG tablet Take 3 tabs PO daily x 5 days. 15 tablet Trevor Iha, FNP   benzonatate (TESSALON) 200 MG capsule Take 1 capsule (200 mg total) by mouth 3 (three) times daily as needed for up to 7 days. 40 capsule Trevor Iha, FNP      PDMP not reviewed this encounter.   Trevor Iha, FNP 02/09/22 1515

## 2022-02-09 NOTE — ED Triage Notes (Signed)
Pt presents with c/o nasal congestion, wheezing, and fatigue that began 1 week ago.

## 2022-02-09 NOTE — Discharge Instructions (Addendum)
Advised patient to take medications as directed with food to completion.  Advised patient to take prednisone with doxycycline for the next 5 of 7 days.  Advised may use Tessalon daily or as needed for cough.  Encouraged patient increase daily water intake while taking this medications.  Advised if symptoms worsen and/or unresolved please follow-up with PCP or here for further evaluation.

## 2022-05-14 ENCOUNTER — Ambulatory Visit
Admission: EM | Admit: 2022-05-14 | Discharge: 2022-05-14 | Disposition: A | Discharge status: Home / Self Care | Payer: 59 | Attending: Family Medicine | Admitting: Family Medicine

## 2022-05-14 ENCOUNTER — Ambulatory Visit (INDEPENDENT_AMBULATORY_CARE_PROVIDER_SITE_OTHER): Payer: 59

## 2022-05-14 DIAGNOSIS — W19XXXA Unspecified fall, initial encounter: Secondary | ICD-10-CM | POA: Diagnosis not present

## 2022-05-14 DIAGNOSIS — J40 Bronchitis, not specified as acute or chronic: Secondary | ICD-10-CM | POA: Diagnosis not present

## 2022-05-14 DIAGNOSIS — R0781 Pleurodynia: Secondary | ICD-10-CM

## 2022-05-14 DIAGNOSIS — S2242XA Multiple fractures of ribs, left side, initial encounter for closed fracture: Secondary | ICD-10-CM | POA: Diagnosis not present

## 2022-05-14 MED ORDER — OXYCODONE-ACETAMINOPHEN 5-325 MG PO TABS: 1.0000 | ORAL_TABLET | Freq: Three times a day (TID) | ORAL | 0 refills | Status: AC | PRN

## 2022-05-14 MED ORDER — PREDNISONE 10 MG (21) PO TBPK: ORAL_TABLET | Freq: Every day | ORAL | 0 refills | Status: AC

## 2022-05-14 NOTE — ED Provider Notes (Signed)
[facility]    CSN: [id number] Arrival date & time: 05/14/22  1712      History   Chief Complaint Chief Complaint  Patient presents with   Fall    HPI Becky Warner is a 77 y.o. female.   HPI Pleasant 77 year old female presents with left-sided rib pain secondary to fall down hill in her yard 3 days ago.  Patient is also complaining of mild shortness of breath, SpO2 at triage was 91%. PMH significant for obesity, atrial fibrillation, HTN, and osteoporosis.  Past Medical History:  Diagnosis Date   Atrial fibrillation    Hypertension     Patient Active Problem List   Diagnosis Date Noted   Anxiety 09/08/2016   Depression 09/08/2016   GERD (gastroesophageal reflux disease) 09/08/2016   Hyperlipidemia 09/08/2016   Osteoporosis 09/08/2016   Pernicious anemia 09/08/2016   Lumbar spondylosis 07/10/2013   Hematuria of undiagnosed cause 04/02/2011    Past Surgical History:  Procedure Laterality Date   EXPLORATORY LAPAROTOMY     HYSTEROTOMY     ORIF FEMUR FRACTURE     TUBAL LIGATION      OB History     Gravida  3   Para  3   Term      Preterm      AB      Living  3      SAB      IAB      Ectopic      Multiple      Live Births               Home Medications    Prior to Admission medications   Medication Sig Start Date End Date Taking? Authorizing Provider  oxyCODONE-acetaminophen (PERCOCET/ROXICET) 5-325 MG tablet Take 1 tablet by mouth every 8 (eight) hours as needed for severe pain. 05/14/22  Yes Trevor Iha, FNP  predniSONE (STERAPRED UNI-PAK 21 TAB) 10 MG (21) TBPK tablet Take by mouth daily. Take 6 tabs by mouth daily  for 2 days, then 5 tabs for 2 days, then 4 tabs for 2 days, then 3 tabs for 2 days, 2 tabs for 2 days, then 1 tab by mouth daily for 2 days 05/14/22  Yes Trevor Iha, FNP  albuterol (PROAIR HFA) 108 (90 Base) MCG/ACT inhaler INHALE 1 PUFF INTO THE LUNGS EVERY 6 (SIX) HOURS AS NEEDED FOR WHEEZING OR SHORTNESS  OF BREATH. 06/14/15   [provider]  ALPRAZolam Prudy Feeler) 1 MG tablet Take 1 mg by mouth 2 (two) times daily. 09/03/16   [provider]  amLODipine (NORVASC) 5 MG tablet Take 10 mg by mouth.    [provider]  calcium carbonate (TUMS EX) 750 MG chewable tablet Chew 750 mg by mouth.    [provider]  estradiol (ESTRACE) 1 MG tablet Take 1 mg by mouth.    [provider]  Multiple Vitamin (MULTIVITAMIN) capsule Take by mouth.    [provider]  nystatin (MYCOSTATIN/NYSTOP) powder Apply topically 4 (four) times daily. 09/08/16   Reva Bores, MD  omeprazole (PRILOSEC) 40 MG capsule TAKE ONE CAPSULE (40 MG TOTAL) BY MOUTH DAILY. 08/06/16   [provider]  venlafaxine XR (EFFEXOR-XR) 75 MG 24 hr capsule Take 75 mg by mouth daily. 12/30/18   [provider]  vitamin C (ASCORBIC ACID) 500 MG tablet Take 500 mg by mouth.    [provider]    Family History History reviewed. No pertinent family history.  Social History Social History   Tobacco Use   Smoking status: Every Day   Smokeless tobacco: Current  Substance Use Topics   Alcohol use: No   Drug use: No     Allergies   Penicillins, Sulfa antibiotics, and Sulfamethoxazole   Review of Systems Review of Systems  Respiratory:  Positive for shortness of breath.   Musculoskeletal:        Left-sided rib pain  All other systems reviewed and are negative.    Physical Exam Triage Vital Signs ED Triage Vitals [05/14/22 1724]  Enc Vitals Group     BP 135/83     Pulse Rate 79     Resp 18     Temp 97.8 F (36.6 C)     Temp Source Oral     SpO2 91 %     Weight      Height      Head Circumference      Peak Flow      Pain Score 8     Pain Loc      Pain Edu?      Excl. in GC?    No data found.  Updated Vital Signs BP 135/83 (BP Location: Right Arm)   Pulse 79   Temp 97.8 F (36.6 C) (Oral)   Resp 18   SpO2 91%      Physical Exam Vitals  and nursing note reviewed.  Constitutional:      Appearance: Normal appearance. She is obese. She is ill-appearing.  HENT:     Head: Normocephalic and atraumatic.     Right Ear: Tympanic membrane, ear canal and external ear normal.     Left Ear: Tympanic membrane, ear canal and external ear normal.     Nose: Nose normal.     Mouth/Throat:     Mouth: Mucous membranes are moist.     Pharynx: Oropharynx is clear.  Eyes:     Extraocular Movements: Extraocular movements intact.     Conjunctiva/sclera: Conjunctivae normal.     Pupils: Pupils are equal, round, and reactive to light.  Cardiovascular:     Rate and Rhythm: Normal rate and regular rhythm.     Pulses: Normal pulses.     Heart sounds: Normal heart sounds.  Pulmonary:     Effort: Pulmonary effort is normal.     Breath sounds: Rhonchi present.     Comments: Diffuse scattered rhonchi noted throughout; TTP over left lateral third through fifth ribs with significant ecchymosis with soft tissue swelling noted, presumably contusions from recent fall at home in front yard Musculoskeletal:        General: Normal range of motion.     Cervical back: Normal range of motion and neck supple.  Skin:    General: Skin is warm and dry.  Neurological:     General: No focal deficit present.     Mental Status: She is alert and oriented to person, place, and time. Mental status is at baseline.      UC Treatments / Results  Labs (all labs ordered are listed, but only abnormal results are displayed) Labs Reviewed - No data to display  EKG   Radiology DG Ribs Bilateral  Result Date: 05/14/2022 CLINICAL DATA:  Fall 3 days ago, bilateral rib pain EXAM: BILATERAL RIBS - 3+ VIEW COMPARISON:  09/21/2020 FINDINGS: Nondisplaced fractures of the lateral left fourth and fifth ribs. No displaced fracture or other bone lesions are seen involving the ribs. Cardiomegaly. No acute abnormality of  the lungs. IMPRESSION: 1. Nondisplaced fractures of the  lateral left fourth and fifth ribs. No displaced fracture or other abnormality. 2. No pneumothorax. 3. Cardiomegaly. Electronically Signed   By: Jearld LeschAlex D Bibbey M.D.   On: 05/14/2022 17:49    Procedures Procedures (including critical care time)  Medications Ordered in UC Medications - No data to display  Initial Impression / Assessment and Plan / UC Course  I have reviewed the triage vital signs and the nursing notes.  Pertinent labs & imaging results that were available during my care of the patient were reviewed by me and considered in my medical decision making (see chart for details).     MDM: 1.  Closed traumatic nondisplaced fracture of 2 ribs of left side, initial encounter-rib x-rays are revealed above, Rx'd Percocet 5/325 mg take 1 tablet every 8 hours, as needed for rib pain; 2.  Bronchitis-Rx Sterapred Unipak (tapering from 60 mg to 10 mg) over 10 days. Advised patient of nondisplaced fractures of left fourth and fifth ribs.  Advised may use Percocet every 8 hours for rib pain.  Advised patient to start Cooper RenderSterapred Unipak tomorrow morning, Friday, 05/15/2022.  Encouraged increase daily water intake to 64 ounces per day while taking these medications.  Advised patient to follow-up with PCP or here on or about 06/28/2022 (or 6-week mark from today's x-rays to ensure ribs are healing).  Advised if symptoms worsen and/or unresolved please follow-up with PCP or here for further evaluation.  Patient discharged home, hemodynamically stable. Final Clinical Impressions(s) / UC Diagnoses   Final diagnoses:  Closed traumatic nondisplaced fracture of two ribs of left side, initial encounter  Bronchitis     Discharge Instructions      Advised patient of nondisplaced fractures of left fourth and fifth ribs.  Advised may use Percocet every 8 hours for rib pain.  Advised patient to start Cooper RenderSterapred Unipak tomorrow morning, Friday, 05/15/2022.  Encouraged increase daily water intake to 64 ounces per day  while taking these medications.  Advised patient to follow-up with PCP or here on or about 06/28/2022 (or 6-week mark from today's x-rays to ensure ribs are healing).  Advised if symptoms worsen and/or unresolved please follow-up with PCP or here for further evaluation.      ED Prescriptions     Medication Sig Dispense Auth. Provider   predniSONE (STERAPRED UNI-PAK 21 TAB) 10 MG (21) TBPK tablet Take by mouth daily. Take 6 tabs by mouth daily  for 2 days, then 5 tabs for 2 days, then 4 tabs for 2 days, then 3 tabs for 2 days, 2 tabs for 2 days, then 1 tab by mouth daily for 2 days 42 tablet Trevor Ihaagan, Annette Bertelson, FNP   oxyCODONE-acetaminophen (PERCOCET/ROXICET) 5-325 MG tablet Take 1 tablet by mouth every 8 (eight) hours as needed for severe pain. 30 tablet Trevor Ihaagan, Twala Collings, FNP      I have reviewed the PDMP during this encounter.   Trevor IhaRagan, Tulip Meharg, FNP 05/14/22 [id number]

## 2022-05-14 NOTE — ED Triage Notes (Signed)
Pt presents with c/o bilateral rib pain after falling down a hill 3 days ago

## 2022-05-14 NOTE — Discharge Instructions (Addendum)
Advised patient of nondisplaced fractures of left fourth and fifth ribs.  Advised may use Percocet every 8 hours for rib pain.  Advised patient to start Cooper Render tomorrow morning, Friday, 05/15/2022.  Encouraged increase daily water intake to 64 ounces per day while taking these medications.  Advised patient to follow-up with PCP or here on or about 06/28/2022 (or 6-week mark from today's x-rays to ensure ribs are healing).  Advised if symptoms worsen and/or unresolved please follow-up with PCP or here for further evaluation.

## 2022-07-24 ENCOUNTER — Ambulatory Visit
Admission: EM | Admit: 2022-07-24 | Discharge: 2022-07-24 | Disposition: A | Discharge status: Home / Self Care | Payer: 59

## 2022-07-24 ENCOUNTER — Encounter: Payer: Self-pay | Admitting: Family Medicine

## 2022-07-24 DIAGNOSIS — R0689 Other abnormalities of breathing: Secondary | ICD-10-CM

## 2022-07-24 DIAGNOSIS — R0602 Shortness of breath: Secondary | ICD-10-CM

## 2022-07-24 NOTE — ED Provider Notes (Signed)
[facility]    CSN: [id number] Arrival date & time: 07/24/22  1452      History   Chief Complaint Chief Complaint  Patient presents with   Shortness of Breath    HPI Becky Warner is a 77 y.o. female.   HPI pleasant 77 year old female presents with cough and shortness of breath for 2 weeks.  Patient has audible wheezing in triage, O2 saturation at 91%, tachypneic, and pale in color.  Patient husband is waiting in car currently.  PMH significant for A-fib, tobacco abuse, and HTN.  Past Medical History:  Diagnosis Date   Atrial fibrillation [facility])    Hypertension     Patient Active Problem List   Diagnosis Date Noted   Anxiety 09/08/2016   Depression 09/08/2016   GERD (gastroesophageal reflux disease) 09/08/2016   Hyperlipidemia 09/08/2016   Osteoporosis 09/08/2016   Pernicious anemia 09/08/2016   Lumbar spondylosis 07/10/2013   Hematuria of undiagnosed cause 04/02/2011    Past Surgical History:  Procedure Laterality Date   EXPLORATORY LAPAROTOMY     HYSTEROTOMY     ORIF FEMUR FRACTURE     TUBAL LIGATION      OB History     Gravida  3   Para  3   Term      Preterm      AB      Living  3      SAB      IAB      Ectopic      Multiple      Live Births               Home Medications    Prior to Admission medications   Medication Sig Start Date End Date Taking? Authorizing Provider  albuterol (PROAIR HFA) 108 (90 Base) MCG/ACT inhaler INHALE 1 PUFF INTO THE LUNGS EVERY 6 (SIX) HOURS AS NEEDED FOR WHEEZING OR SHORTNESS OF BREATH. 06/14/15   [provider]  ALPRAZolam Prudy Feeler) 1 MG tablet Take 1 mg by mouth 2 (two) times daily. 09/03/16   [provider]  amLODipine (NORVASC) 5 MG tablet Take 10 mg by mouth.    [provider]  calcium carbonate (TUMS EX) 750 MG chewable tablet Chew 750 mg by mouth.    [provider]  estradiol (ESTRACE) 1 MG tablet Take 1 mg by mouth.    [provider]   Multiple Vitamin (MULTIVITAMIN) capsule Take by mouth.    [provider]  nystatin (MYCOSTATIN/NYSTOP) powder Apply topically 4 (four) times daily. 09/08/16   Reva Bores, MD  omeprazole (PRILOSEC) 40 MG capsule TAKE ONE CAPSULE (40 MG TOTAL) BY MOUTH DAILY. 08/06/16   [provider]  oxyCODONE-acetaminophen (PERCOCET/ROXICET) 5-325 MG tablet Take 1 tablet by mouth every 8 (eight) hours as needed for severe pain. 05/14/22   Trevor Iha, FNP  predniSONE (STERAPRED UNI-PAK 21 TAB) 10 MG (21) TBPK tablet Take by mouth daily. Take 6 tabs by mouth daily  for 2 days, then 5 tabs for 2 days, then 4 tabs for 2 days, then 3 tabs for 2 days, 2 tabs for 2 days, then 1 tab by mouth daily for 2 days 05/14/22   Trevor Iha, FNP  venlafaxine XR (EFFEXOR-XR) 75 MG 24 hr capsule Take 75 mg by mouth daily. 12/30/18   [provider]  vitamin C (ASCORBIC ACID) 500 MG tablet Take 500 mg by mouth.    [provider]    Family History History  reviewed. No pertinent family history.  Social History Social History   Tobacco Use   Smoking status: Every Day   Smokeless tobacco: Current  Substance Use Topics   Alcohol use: No   Drug use: No     Allergies   Penicillins, Sulfa antibiotics, and Sulfamethoxazole   Review of Systems Review of Systems  Constitutional:  Positive for fever.  Respiratory:  Positive for cough and shortness of breath.   All other systems reviewed and are negative.    Physical Exam Triage Vital Signs ED Triage Vitals  Enc Vitals Group     BP      Pulse      Resp      Temp      Temp src      SpO2      Weight      Height      Head Circumference      Peak Flow      Pain Score      Pain Loc      Pain Edu?      Excl. in GC?    No data found.  Updated Vital Signs BP 126/87 (BP Location: Right Arm)   Pulse 87   Temp 98.6 F (37 C) (Oral)   Resp (!) 38   SpO2 91%       Physical Exam Vitals and nursing note reviewed.   Constitutional:      Appearance: Normal appearance. She is well-developed and normal weight. She is ill-appearing.  HENT:     Head: Normocephalic and atraumatic.     Mouth/Throat:     Mouth: Mucous membranes are moist.     Pharynx: Oropharynx is clear. No posterior oropharyngeal erythema.  Eyes:     Extraocular Movements: Extraocular movements intact.     Conjunctiva/sclera: Conjunctivae normal.     Pupils: Pupils are equal, round, and reactive to light.  Cardiovascular:     Pulses: Normal pulses.     Heart sounds: Normal heart sounds.     Comments: Irregularly irregular Pulmonary:     Breath sounds: Stridor present. Wheezing, rhonchi and rales present.     Comments: Tachypneic, respirations 28-32, adventitious breath sounds with wheezes noted throughout Musculoskeletal:        General: Normal range of motion.     Cervical back: Normal range of motion and neck supple. No tenderness.  Lymphadenopathy:     Cervical: No cervical adenopathy.  Skin:    General: Skin is warm and dry.     Coloration: Skin is pale.  Neurological:     General: No focal deficit present.     Mental Status: She is alert and oriented to person, place, and time. Mental status is at baseline.     Motor: Weakness present.  Psychiatric:        Mood and Affect: Mood normal.        Behavior: Behavior normal.      UC Treatments / Results  Labs (all labs ordered are listed, but only abnormal results are displayed) Labs Reviewed - No data to display  EKG   Radiology No results found.  Procedures Procedures (including critical care time)  Medications Ordered in UC Medications - No data to display  Initial Impression / Assessment and Plan / UC Course  I have reviewed the triage vital signs and the nursing notes.  Pertinent labs & imaging results that were available during my care of the patient were reviewed by me and considered in  my medical decision making (see chart for details).     MDM: 1.   Shortness of breath and 2. Adventitious breath sounds-patient/husband advised to go to [facility] ED now for immediate evaluation of shortness of breath and wheezing.  Patient agreed and verbalized understanding of these instructions and this plan of care patient's husband will be driving patient by personal vehicle tonight.  Patient discharged hemodynamically stable.  Taken to personal vehicle in wheelchair at discharge. Final Clinical Impressions(s) / UC Diagnoses   Final diagnoses:  SOB (shortness of breath)  Adventitious breath sounds     Discharge Instructions      Advised patient/husband to go to [facility] ED now for further evaluation of shortness of breath and wheezing.     ED Prescriptions   None    PDMP not reviewed this encounter.   Trevor Iha, FNP 07/24/22 1555

## 2022-07-24 NOTE — ED Triage Notes (Signed)
Cough & SOB x 2 weeks  Pt has audible wheezes in triage Pt's husband is waiting in the car  Pt's color is pale  O2 sats 91 in triage - came up to 94 in triage after sitting and removing mask  Provider  called to room

## 2022-07-24 NOTE — ED Notes (Addendum)
Abbreviated triage due to pt status. Pt refused EMS - POV w/ husband - report called to [facility] ED

## 2022-07-24 NOTE — ED Notes (Signed)
Patient is being discharged from the Urgent Care and sent to the Emergency Department via POV driven by husband. Per Trevor Iha FNP, patient is in need of higher level of care due to SOB/Wheezing/Tachypnea. Patient is aware and verbalizes understanding of plan of care.  Vitals:   07/24/22 1513  BP: 126/87  Pulse: 87  Resp: (!) 38  Temp: 98.6 F (37 C)  SpO2: 91%

## 2022-07-24 NOTE — Discharge Instructions (Addendum)
Advised patient/husband to go to [facility] ED now for further evaluation of shortness of breath and wheezing.
